# Patient Record
Sex: Female | Born: 1980
Health system: Southern US, Community
[De-identification: ages and names within clinical notes are randomized; demographics above are authoritative.]

## PROBLEM LIST (undated history)

## (undated) DIAGNOSIS — N938 Other specified abnormal uterine and vaginal bleeding: Secondary | ICD-10-CM

## (undated) DIAGNOSIS — K219 Gastro-esophageal reflux disease without esophagitis: Secondary | ICD-10-CM

## (undated) DIAGNOSIS — K635 Polyp of colon: Secondary | ICD-10-CM

## (undated) DIAGNOSIS — R197 Diarrhea, unspecified: Secondary | ICD-10-CM

## (undated) DIAGNOSIS — N390 Urinary tract infection, site not specified: Secondary | ICD-10-CM

## (undated) DIAGNOSIS — B019 Varicella without complication: Secondary | ICD-10-CM

## (undated) DIAGNOSIS — T7840XA Allergy, unspecified, initial encounter: Secondary | ICD-10-CM

## (undated) DIAGNOSIS — M5430 Sciatica, unspecified side: Secondary | ICD-10-CM

## (undated) HISTORY — DX: Urinary tract infection, site not specified: N39.0

## (undated) HISTORY — DX: Sciatica, unspecified side: M54.30

## (undated) HISTORY — DX: Diarrhea, unspecified: R19.7

## (undated) HISTORY — DX: Allergy, unspecified, initial encounter: T78.40XA

## (undated) HISTORY — DX: Varicella without complication: B01.9

## (undated) HISTORY — PX: OTHER SURGICAL HISTORY: SHX169

## (undated) HISTORY — DX: Polyp of colon: K63.5

---

## 2000-06-15 ENCOUNTER — Other Ambulatory Visit: Admission: RE | Admit: 2000-06-15 | Discharge: 2000-06-15 | Payer: Self-pay | Admitting: Gynecology

## 2000-11-15 ENCOUNTER — Emergency Department (HOSPITAL_COMMUNITY): Admission: EM | Admit: 2000-11-15 | Discharge: 2000-11-16 | Payer: Self-pay | Admitting: Emergency Medicine

## 2000-11-16 ENCOUNTER — Emergency Department (HOSPITAL_COMMUNITY): Admission: EM | Admit: 2000-11-16 | Discharge: 2000-11-17 | Payer: Self-pay | Admitting: Emergency Medicine

## 2001-07-14 ENCOUNTER — Other Ambulatory Visit: Admission: RE | Admit: 2001-07-14 | Discharge: 2001-07-14 | Payer: Self-pay | Admitting: Gynecology

## 2002-07-18 ENCOUNTER — Other Ambulatory Visit: Admission: RE | Admit: 2002-07-18 | Discharge: 2002-07-18 | Payer: Self-pay | Admitting: Gynecology

## 2005-03-25 ENCOUNTER — Other Ambulatory Visit: Admission: RE | Admit: 2005-03-25 | Discharge: 2005-03-25 | Payer: Self-pay | Admitting: Gynecology

## 2006-09-02 ENCOUNTER — Emergency Department (HOSPITAL_COMMUNITY): Admission: EM | Admit: 2006-09-02 | Discharge: 2006-09-02 | Payer: Self-pay | Admitting: Emergency Medicine

## 2006-11-16 ENCOUNTER — Emergency Department (HOSPITAL_COMMUNITY): Admission: EM | Admit: 2006-11-16 | Discharge: 2006-11-16 | Payer: Self-pay | Admitting: Emergency Medicine

## 2007-10-16 ENCOUNTER — Emergency Department (HOSPITAL_COMMUNITY): Admission: EM | Admit: 2007-10-16 | Discharge: 2007-10-17 | Payer: Self-pay | Admitting: Emergency Medicine

## 2007-10-18 ENCOUNTER — Emergency Department (HOSPITAL_COMMUNITY): Admission: EM | Admit: 2007-10-18 | Discharge: 2007-10-18 | Payer: Self-pay | Admitting: Emergency Medicine

## 2007-10-22 ENCOUNTER — Emergency Department (HOSPITAL_COMMUNITY): Admission: EM | Admit: 2007-10-22 | Discharge: 2007-10-23 | Payer: Self-pay | Admitting: Emergency Medicine

## 2007-12-23 ENCOUNTER — Emergency Department (HOSPITAL_COMMUNITY): Admission: EM | Admit: 2007-12-23 | Discharge: 2007-12-23 | Payer: Self-pay | Admitting: Emergency Medicine

## 2008-02-11 ENCOUNTER — Emergency Department (HOSPITAL_COMMUNITY): Admission: EM | Admit: 2008-02-11 | Discharge: 2008-02-11 | Payer: Self-pay | Admitting: Family Medicine

## 2009-10-04 ENCOUNTER — Emergency Department (HOSPITAL_COMMUNITY): Admission: EM | Admit: 2009-10-04 | Discharge: 2009-10-04 | Payer: Self-pay | Admitting: Family Medicine

## 2010-06-01 ENCOUNTER — Emergency Department (HOSPITAL_COMMUNITY)
Admission: EM | Admit: 2010-06-01 | Discharge: 2010-06-01 | Payer: Self-pay | Source: Home / Self Care | Admitting: Emergency Medicine

## 2010-06-01 LAB — POCT RAPID STREP A (OFFICE): Streptococcus, Group A Screen (Direct): NEGATIVE

## 2010-09-11 ENCOUNTER — Emergency Department (HOSPITAL_COMMUNITY)
Admission: EM | Admit: 2010-09-11 | Discharge: 2010-09-11 | Disposition: A | Discharge status: Home / Self Care | Payer: Self-pay | Attending: Emergency Medicine | Admitting: Emergency Medicine

## 2010-09-11 DIAGNOSIS — Z139 Encounter for screening, unspecified: Secondary | ICD-10-CM | POA: Insufficient documentation

## 2011-01-30 LAB — URINALYSIS, ROUTINE W REFLEX MICROSCOPIC
Bilirubin Urine: NEGATIVE
Glucose, UA: NEGATIVE
Hgb urine dipstick: NEGATIVE
Ketones, ur: NEGATIVE
Nitrite: NEGATIVE
Protein, ur: NEGATIVE
Specific Gravity, Urine: 1.004 — ABNORMAL LOW
Urobilinogen, UA: 0.2
pH: 7

## 2011-01-30 LAB — COMPREHENSIVE METABOLIC PANEL
ALT: 21
AST: 20
Albumin: 3.3 — ABNORMAL LOW
Alkaline Phosphatase: 49
BUN: 8
CO2: 25
Calcium: 8.6
Chloride: 101
Creatinine, Ser: 0.69
GFR calc Af Amer: >60
GFR calc non Af Amer: >60
Glucose, Bld: 108 — ABNORMAL HIGH
Potassium: 3 — ABNORMAL LOW
Sodium: 136
Total Bilirubin: 1.1
Total Protein: 5.8 — ABNORMAL LOW

## 2011-01-30 LAB — POCT PREGNANCY, URINE
Operator id: 294591
Preg Test, Ur: NEGATIVE

## 2011-01-30 LAB — CBC
HCT: 43.8
Hemoglobin: 14.6
MCHC: 33.3
MCV: 91.5
Platelets: 218
RBC: 4.79
RDW: 12.2
WBC: 11 — ABNORMAL HIGH

## 2011-01-30 LAB — PREGNANCY, URINE: Preg Test, Ur: NEGATIVE

## 2011-05-10 ENCOUNTER — Emergency Department (HOSPITAL_COMMUNITY)
Admission: EM | Admit: 2011-05-10 | Discharge: 2011-05-10 | Disposition: A | Discharge status: Home / Self Care | Payer: Self-pay | Source: Home / Self Care | Attending: Emergency Medicine | Admitting: Emergency Medicine

## 2011-05-10 ENCOUNTER — Encounter: Payer: Self-pay | Admitting: *Deleted

## 2011-05-10 DIAGNOSIS — J329 Chronic sinusitis, unspecified: Secondary | ICD-10-CM

## 2011-05-10 DIAGNOSIS — J069 Acute upper respiratory infection, unspecified: Secondary | ICD-10-CM

## 2011-05-10 HISTORY — DX: Gastro-esophageal reflux disease without esophagitis: K21.9

## 2011-05-10 MED ORDER — GUAIFENESIN-CODEINE 100-10 MG/5ML PO SYRP: 10.0000 mL | ORAL_SOLUTION | Freq: Four times a day (QID) | ORAL | Status: AC | PRN

## 2011-05-10 MED ORDER — AMOXICILLIN-POT CLAVULANATE 875-125 MG PO TABS: 1.0000 | ORAL_TABLET | Freq: Two times a day (BID) | ORAL | Status: AC

## 2011-05-10 NOTE — ED Provider Notes (Signed)
Chief Complaint  Patient presents with  . Nasal Congestion  . Sore Throat  . Headache  . Cough    History of Present Illness:  The patient is a 31 year old female who has had a five-day history of nasal congestion with clear drainage, sneezing, headache, sinus pressure, sore throat, cough productive of clear sputum, and chest congestion. She denies fevers, chills, sweats, aches, shortness of breath, wheezing, nausea, vomiting, or diarrhea.  Review of Systems:  Other than noted above, the patient denies any of the following symptoms. Systemic:  No fever, chills, sweats, fatigue, myalgias, headache, or anorexia. Eye:  No redness, pain or drainage. ENT:  No earache, nasal congestion, rhinorrhea, sinus pressure, or sore throat. Lungs:  No cough, sputum production, wheezing, shortness of breath. Or chest pain. GI:  No nausea, vomiting, abdominal pain or diarrhea. Skin:  No rash or itching.  PMFSH:  Past medical history, family history, social history, meds, and allergies were reviewed.  Physical Exam:   Vital signs:  BP 117/76  Pulse 69  Temp(Src) 98.3 F (36.8 C) (Oral)  Resp 14  SpO2 97%  LMP 05/01/2011 General:  Alert, in no distress. Eye:  No conjunctival injection or drainage. ENT:  TMs and canals were normal, without erythema or inflammation.  Nasal mucosa was clear and uncongested, without drainage.  Mucous membranes were moist.  Pharynx was clear, without exudate or drainage.  There were no oral ulcerations or lesions. Neck:  Supple, no adenopathy, tenderness or mass. Lungs:  No respiratory distress.  Lungs were clear to auscultation, without wheezes, rales or rhonchi.  Breath sounds were clear and equal bilaterally. Heart:  Regular rhythm, without gallops, murmers or rubs. Skin:  Clear, warm, and dry, without rash or lesions.  Labs:   Results for orders placed during the hospital encounter of 06/01/10  POCT RAPID STREP A      Component Value Range   Streptococcus, Group A  Screen (Direct) NEGATIVE  NEGATIVE      Radiology:  No results found.  Medications given in [facility]:  None  Assessment:  Viral upper respiratory infection. Sinusitis. Bronchitis.  Plan:   1.  The following meds were prescribed:   New Prescriptions   AMOXICILLIN-CLAVULANATE (AUGMENTIN) 875-125 MG PER TABLET    Take 1 tablet by mouth 2 (two) times daily.   GUAIFENESIN-CODEINE (GUIATUSS AC) 100-10 MG/5ML SYRUP    Take 10 mLs by mouth 4 (four) times daily as needed for cough.   2.  The patient was instructed in symptomatic care and handouts were given. 3.  The patient was told to return if becoming worse in any way, if no better in 3 or 4 days, and given some red flag symptoms that would indicate earlier return.   Roque Lias, MD 05/10/11 1501

## 2011-05-10 NOTE — ED Notes (Addendum)
sorethroat resolved - now with mild cough sinus congestion and pressure headache - took left over cephalexin two Thursday one today

## 2011-09-01 ENCOUNTER — Encounter (HOSPITAL_COMMUNITY): Payer: Self-pay | Admitting: Emergency Medicine

## 2011-09-01 ENCOUNTER — Emergency Department (HOSPITAL_COMMUNITY)
Admission: EM | Admit: 2011-09-01 | Discharge: 2011-09-01 | Disposition: A | Discharge status: Home / Self Care | Payer: Self-pay | Source: Home / Self Care | Attending: Family Medicine | Admitting: Family Medicine

## 2011-09-01 DIAGNOSIS — J309 Allergic rhinitis, unspecified: Secondary | ICD-10-CM

## 2011-09-01 DIAGNOSIS — J302 Other seasonal allergic rhinitis: Secondary | ICD-10-CM

## 2011-09-01 MED ORDER — FLUTICASONE PROPIONATE 50 MCG/ACT NA SUSP: 2.0000 | Freq: Every day | NASAL | Status: DC

## 2011-09-01 MED ORDER — CETIRIZINE HCL 10 MG PO TABS: 10.0000 mg | ORAL_TABLET | Freq: Every day | ORAL | Status: DC

## 2011-09-01 NOTE — ED Provider Notes (Signed)
History     CSN: [id number]  Arrival date & time 09/01/11  1627   First MD Initiated Contact with Patient 09/01/11 1639      No chief complaint on file.   (Consider location/radiation/quality/duration/timing/severity/associated sxs/prior treatment) Patient is a 31 y.o. female presenting with URI. The history is provided by the patient.  URI The primary symptoms include fatigue and sore throat. Primary symptoms do not include fever, nausea, vomiting, myalgias, arthralgias or rash. The current episode started yesterday. This is a new problem. The problem has not changed since onset. Symptoms associated with the illness include congestion and rhinorrhea.    Past Medical History  Diagnosis Date  . Acid reflux     No past surgical history on file.  No family history on file.  History  Substance Use Topics  . Smoking status: Never Smoker   . Smokeless tobacco: Not on file  . Alcohol Use: Yes    OB History    Grav Para Term Preterm Abortions TAB SAB Ect Mult Living                  Review of Systems  Constitutional: Positive for fatigue. Negative for fever.  HENT: Positive for congestion, sore throat, rhinorrhea, sneezing and postnasal drip.   Respiratory: Negative.   Gastrointestinal: Negative.  Negative for nausea and vomiting.  Genitourinary: Negative.   Musculoskeletal: Negative for myalgias and arthralgias.  Skin: Negative for rash.    Allergies  Ceclor  Home Medications   Current Outpatient Rx  Name Route Sig Dispense Refill  . CETIRIZINE HCL 10 MG PO TABS Oral Take 1 tablet (10 mg total) by mouth daily. One tab daily for allergies 30 tablet 1  . FLUTICASONE PROPIONATE 50 MCG/ACT NA SUSP Nasal Place 2 sprays into the nose daily. 1 g 2  . GUAIFENESIN ER 600 MG PO TB12 Oral Take 600 mg by mouth 2 (two) times daily.      Marland Kitchen PSEUDOEPHEDRINE HCL 30 MG PO TABS Oral Take 30 mg by mouth every 4 (four) hours as needed.        BP 99/59  Pulse 70  Temp(Src) 98.1  F (36.7 C) (Oral)  Resp 18  SpO2 100%  Physical Exam  Nursing note and vitals reviewed. Constitutional: She is oriented to person, place, and time. She appears well-developed and well-nourished.  HENT:  Head: Normocephalic.  Right Ear: External ear normal.  Left Ear: External ear normal.  Nose: Mucosal edema and rhinorrhea present.  Mouth/Throat: Oropharynx is clear and moist.  Eyes: Pupils are equal, round, and reactive to light.  Neck: Normal range of motion. Neck supple.  Cardiovascular: Normal rate, normal heart sounds and intact distal pulses.   Pulmonary/Chest: Effort normal and breath sounds normal.  Lymphadenopathy:    She has no cervical adenopathy.  Neurological: She is alert and oriented to person, place, and time.  Skin: Skin is warm and dry.  Psychiatric: She has a normal mood and affect.    ED Course  Procedures (including critical care time)  Labs Reviewed - No data to display No results found.   1. Seasonal allergic rhinitis       MDM          Linna Hoff, MD 09/01/11 1739

## 2011-09-01 NOTE — ED Notes (Signed)
PT HERE WITH SCRATCHY THROAT,H/A AND DRY MOUTH THAT STARTED YESTERDAY UNRELIEVED BY OTC NYQUIL.DENIES FEVER,CHILLS,N,V

## 2011-09-01 NOTE — Discharge Instructions (Signed)
Drink plenty of fluids as discussed, use medicine as prescribed, and mucinex or delsym for cough. Return or see your doctor if further problems °

## 2011-10-20 ENCOUNTER — Encounter: Payer: Self-pay | Admitting: *Deleted

## 2011-10-20 NOTE — Telephone Encounter (Signed)
Opened in error

## 2012-01-05 ENCOUNTER — Ambulatory Visit: Payer: Self-pay | Admitting: Internal Medicine

## 2012-01-05 VITALS — BP 112/68 | HR 74 | Temp 98.2°F | Resp 16 | Ht 65.0 in | Wt 158.0 lb

## 2012-01-05 DIAGNOSIS — J019 Acute sinusitis, unspecified: Secondary | ICD-10-CM

## 2012-01-05 MED ORDER — AMOXICILLIN 500 MG PO CAPS: 1000.0000 mg | ORAL_CAPSULE | Freq: Two times a day (BID) | ORAL | Status: DC

## 2012-01-05 MED ORDER — AMOXICILLIN 500 MG PO CAPS: 1000.0000 mg | ORAL_CAPSULE | Freq: Two times a day (BID) | ORAL | Status: AC

## 2012-01-05 NOTE — Addendum Note (Signed)
Addended by: Cydney Ok on: 01/05/2012 10:15 AM   Modules accepted: Orders

## 2012-01-05 NOTE — Progress Notes (Signed)
  Subjective:    Patient ID: Suzanne Atkinson, female    DOB: 1980/06/17, 31 y.o.   MRN: [id number]  HPI presents today with sinus congestion. Started with a sore throat Friday, has a slight cough, PND,    Allergic to Ceclor as a child but no problems with penicillin in History of allergic rhinitis seasonal Review of Systems No ongoing meds or problems    Objective:   Physical Exam TMs clear Nares with purulence/tender left maxillary to percussion Throat postnasal drainage obvious No nodes No wheezing       Assessment & Plan:  Problem #1 sinusitis Meds ordered this encounter  Medications  . amoxicillin (AMOXIL) 500 MG capsule    Sig: Take 2 capsules (1,000 mg total) by mouth 2 (two) times daily.    Dispense:  40 capsule    Refill:  0

## 2012-04-22 ENCOUNTER — Ambulatory Visit: Payer: Self-pay | Admitting: Physician Assistant

## 2012-04-22 VITALS — BP 116/77 | HR 82 | Temp 98.9°F | Resp 16 | Ht 64.5 in | Wt 162.0 lb

## 2012-04-22 DIAGNOSIS — J029 Acute pharyngitis, unspecified: Secondary | ICD-10-CM

## 2012-04-22 DIAGNOSIS — J40 Bronchitis, not specified as acute or chronic: Secondary | ICD-10-CM

## 2012-04-22 LAB — POCT RAPID STREP A (OFFICE): Rapid Strep A Screen: NEGATIVE

## 2012-04-22 MED ORDER — AZITHROMYCIN 250 MG PO TABS: ORAL_TABLET | ORAL | Status: DC

## 2012-04-22 NOTE — Progress Notes (Signed)
  Subjective:    Patient ID: Suzanne Atkinson, female    DOB: July 09, 1980, 31 y.o.   MRN: [id number]  HPI 31 year old female presents with 1 week history of nasal congestion, sore throat, slight dry cough, and some nausea. No vomiting, abdominal pain, headache, sinus pain, otalgia, fevers, or chills.  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 She does have a history of seasonal allergies for which she uses flonase and zyrtec prn.  She has not been using these with this illness.  Otherwise healthy with no other complaints today.      Review of Systems  Constitutional: Negative for fever and chills.  HENT: Positive for congestion, sore throat, rhinorrhea and postnasal drip. Negative for neck pain.   Respiratory: Positive for cough. Negative for chest tightness and wheezing.   Cardiovascular: Negative for chest pain.  Gastrointestinal: Negative for nausea, vomiting and abdominal pain.  Skin: Negative for rash.  Neurological: Negative for dizziness and headaches.       Objective:   Physical Exam  Constitutional: She is oriented to person, place, and time. She appears well-developed and well-nourished.  HENT:  Head: Normocephalic and atraumatic.  Right Ear: Hearing, tympanic membrane, external ear and ear canal normal.  Left Ear: Hearing, tympanic membrane, external ear and ear canal normal.  Mouth/Throat: Oropharynx is clear and moist. No oropharyngeal exudate (clear postnasal drainage).  Eyes: Conjunctivae normal are normal.  Neck: Normal range of motion.  Cardiovascular: Normal rate, regular rhythm and normal heart sounds.   Pulmonary/Chest: Effort normal and breath sounds normal.  Neurological: She is alert and oriented to person, place, and time.  Psychiatric: She has a normal mood and affect. Her behavior is normal. Judgment and thought content normal.          Assessment & Plan:   1. Acute pharyngitis  POCT rapid strep A,  Culture, Group A Strep  2. Bronchitis  azithromycin (ZITHROMAX) 250 MG tablet   Start Zyrtec daily  Flonase bid Azithromycin as directed She has cough syrup at home that she will try, ok to call in hycodan if needed.  Follow up if symptoms worsen or fail to improve

## 2012-04-25 LAB — CULTURE, GROUP A STREP: Organism ID, Bacteria: NORMAL

## 2012-09-16 ENCOUNTER — Emergency Department (HOSPITAL_COMMUNITY): Payer: BC Managed Care – PPO

## 2012-09-16 ENCOUNTER — Encounter (HOSPITAL_COMMUNITY): Payer: Self-pay | Admitting: Emergency Medicine

## 2012-09-16 ENCOUNTER — Emergency Department (HOSPITAL_COMMUNITY)
Admission: EM | Admit: 2012-09-16 | Discharge: 2012-09-16 | Disposition: A | Discharge status: Home / Self Care | Payer: BC Managed Care – PPO | Attending: Emergency Medicine | Admitting: Emergency Medicine

## 2012-09-16 DIAGNOSIS — Z3202 Encounter for pregnancy test, result negative: Secondary | ICD-10-CM | POA: Insufficient documentation

## 2012-09-16 DIAGNOSIS — R4781 Slurred speech: Secondary | ICD-10-CM

## 2012-09-16 DIAGNOSIS — R4789 Other speech disturbances: Secondary | ICD-10-CM | POA: Insufficient documentation

## 2012-09-16 DIAGNOSIS — Z8719 Personal history of other diseases of the digestive system: Secondary | ICD-10-CM | POA: Insufficient documentation

## 2012-09-16 LAB — CBC WITH DIFFERENTIAL/PLATELET
Basophils Absolute: 0 10*3/uL (ref 0.0–0.1)
Basophils Relative: 0 % (ref 0–1)
Eosinophils Absolute: 0.1 10*3/uL (ref 0.0–0.7)
Eosinophils Relative: 1 % (ref 0–5)
HCT: 42.1 % (ref 36.0–46.0)
Hemoglobin: 14.6 g/dL (ref 12.0–15.0)
Lymphocytes Relative: 28 % (ref 12–46)
Lymphs Abs: 1.8 10*3/uL (ref 0.7–4.0)
MCH: 31.1 pg (ref 26.0–34.0)
MCHC: 34.7 g/dL (ref 30.0–36.0)
MCV: 89.6 fL (ref 78.0–100.0)
Monocytes Absolute: 0.6 10*3/uL (ref 0.1–1.0)
Monocytes Relative: 9 % (ref 3–12)
Neutro Abs: 4.1 10*3/uL (ref 1.7–7.7)
Neutrophils Relative %: 62 % (ref 43–77)
Platelets: 224 10*3/uL (ref 150–400)
RBC: 4.7 MIL/uL (ref 3.87–5.11)
RDW: 12.8 % (ref 11.5–15.5)
WBC: 6.5 10*3/uL (ref 4.0–10.5)

## 2012-09-16 LAB — URINALYSIS, ROUTINE W REFLEX MICROSCOPIC
Bilirubin Urine: NEGATIVE
Glucose, UA: NEGATIVE mg/dL
Hgb urine dipstick: NEGATIVE
Ketones, ur: NEGATIVE mg/dL
Leukocytes, UA: NEGATIVE
Nitrite: NEGATIVE
Protein, ur: NEGATIVE mg/dL
Specific Gravity, Urine: 1.028 (ref 1.005–1.030)
Urobilinogen, UA: 0.2 mg/dL (ref 0.0–1.0)
pH: 6 (ref 5.0–8.0)

## 2012-09-16 LAB — COMPREHENSIVE METABOLIC PANEL
ALT: 5 U/L (ref 0–35)
AST: 17 U/L (ref 0–37)
Albumin: 3.8 g/dL (ref 3.5–5.2)
Alkaline Phosphatase: 49 U/L (ref 39–117)
BUN: 12 mg/dL (ref 6–23)
CO2: 27 mEq/L (ref 19–32)
Calcium: 9.7 mg/dL (ref 8.4–10.5)
Chloride: 99 mEq/L (ref 96–112)
Creatinine, Ser: 0.6 mg/dL (ref 0.50–1.10)
GFR calc Af Amer: >90 mL/min (ref >90)
GFR calc non Af Amer: >90 mL/min (ref >90)
Glucose, Bld: 96 mg/dL (ref 70–99)
Potassium: 4.4 mEq/L (ref 3.5–5.1)
Sodium: 136 mEq/L (ref 135–145)
Total Bilirubin: 0.2 mg/dL — ABNORMAL LOW (ref 0.3–1.2)
Total Protein: 7.2 g/dL (ref 6.0–8.3)

## 2012-09-16 LAB — GLUCOSE, CAPILLARY: Glucose-Capillary: 97 mg/dL (ref 70–99)

## 2012-09-16 LAB — POCT PREGNANCY, URINE: Preg Test, Ur: NEGATIVE
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
## 2012-09-16 NOTE — ED Notes (Addendum)
Pt states that about 20 mins ago she started to feel funny and began to have slurred speech also states that her left arm is sore. Denies n/v, headache, blurred vision, SOB.

## 2012-09-16 NOTE — ED Notes (Signed)
Patient transported to MRI 

## 2012-09-16 NOTE — ED Notes (Signed)
[facility], given crackers to eat. cbg rechecked in ED.

## 2012-09-16 NOTE — ED Notes (Signed)
Pt able to ambulate independently to bathroom

## 2012-09-16 NOTE — ED Provider Notes (Signed)
History     CSN: [id number]  Arrival date & time 09/16/12  1116   First MD Initiated Contact with Patient 09/16/12 1231      Chief Complaint  Patient presents with  . Aphasia    (Consider location/radiation/quality/duration/timing/severity/associated sxs/prior treatment) The history is provided by the patient and the spouse.   patient here complaining of trouble forming words and possible slurred speech which began about 20 minutes prior to arrival. Denies any severe headaches. No upper or lower extremity weakness. No visual changes. No history of same. Symptoms have been gradually improving and no treatment used prior to arrival. Nothing makes her symptoms worse.  Past Medical History  Diagnosis Date  . Acid reflux     History reviewed. No pertinent past surgical history.  Family History  Problem Relation Age of Onset  . Cancer Mother     History  Substance Use Topics  . Smoking status: Never Smoker   . Smokeless tobacco: Not on file  . Alcohol Use: Yes    OB History   Grav Para Term Preterm Abortions TAB SAB Ect Mult Living                  Review of Systems  All other systems reviewed and are negative.    Allergies  Ceclor  Home Medications   Current Outpatient Rx  Name  Route  Sig  Dispense  Refill  . ibuprofen (ADVIL,MOTRIN) 200 MG tablet   Oral   Take 400 mg by mouth every 6 (six) hours as needed for pain (headache).         . loratadine (CLARITIN) 10 MG tablet   Oral   Take 10 mg by mouth daily.           BP 152/93  Temp(Src) 97.3 F (36.3 C) (Oral)  Resp 26  SpO2 98%  LMP 08/23/2012  Physical Exam  Nursing note and vitals reviewed. Constitutional: She is oriented to person, place, and time. She appears well-developed and well-nourished.  Non-toxic appearance. No distress.  HENT:  Head: Normocephalic and atraumatic.  Eyes: Conjunctivae, EOM and lids are normal. Pupils are equal, round, and reactive to light.  Neck: Normal range  of motion. Neck supple. No tracheal deviation present. No mass present.  Cardiovascular: Normal rate, regular rhythm and normal heart sounds.  Exam reveals no gallop.   No murmur heard. Pulmonary/Chest: Effort normal and breath sounds normal. No stridor. No respiratory distress. She has no decreased breath sounds. She has no wheezes. She has no rhonchi. She has no rales.  Abdominal: Soft. Normal appearance and bowel sounds are normal. She exhibits no distension. There is no tenderness. There is no rebound and no CVA tenderness.  Musculoskeletal: Normal range of motion. She exhibits no edema and no tenderness.  Neurological: She is alert and oriented to person, place, and time. She has normal strength. No cranial nerve deficit or sensory deficit. GCS eye subscore is 4. GCS verbal subscore is 5. GCS motor subscore is 6.  Skin: Skin is warm and dry. No abrasion and no rash noted.  Psychiatric: She has a normal mood and affect. Her speech is normal and behavior is normal.    ED Course  Procedures (including critical care time)  Labs Reviewed  COMPREHENSIVE METABOLIC PANEL - Abnormal; Notable for the following:    Total Bilirubin 0.2 (*)    All other components within normal limits  URINALYSIS, ROUTINE W REFLEX MICROSCOPIC - Abnormal; Notable for the following:  APPearance CLOUDY (*)    All other components within normal limits  GLUCOSE, CAPILLARY  CBC WITH DIFFERENTIAL  POCT PREGNANCY, URINE   No results found.   No diagnosis found.    MDM  Pt with negative mri and normal neuro exam--pt to be given neuro referral        Toy Baker, MD 09/16/12 1510

## 2012-09-29 ENCOUNTER — Encounter: Payer: Self-pay | Admitting: Neurology

## 2012-09-29 ENCOUNTER — Ambulatory Visit (INDEPENDENT_AMBULATORY_CARE_PROVIDER_SITE_OTHER): Payer: BC Managed Care – PPO | Admitting: Neurology

## 2012-09-29 VITALS — BP 109/72 | HR 66 | Ht 64.0 in | Wt 162.0 lb

## 2012-09-29 DIAGNOSIS — R4789 Other speech disturbances: Secondary | ICD-10-CM

## 2012-09-29 DIAGNOSIS — K219 Gastro-esophageal reflux disease without esophagitis: Secondary | ICD-10-CM

## 2012-09-29 DIAGNOSIS — R4781 Slurred speech: Secondary | ICD-10-CM
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
## 2013-05-27 ENCOUNTER — Emergency Department (INDEPENDENT_AMBULATORY_CARE_PROVIDER_SITE_OTHER)
Admission: EM | Admit: 2013-05-27 | Discharge: 2013-05-27 | Disposition: A | Discharge status: Home / Self Care | Payer: Self-pay | Source: Home / Self Care

## 2013-05-27 ENCOUNTER — Encounter (HOSPITAL_COMMUNITY): Payer: Self-pay | Admitting: Emergency Medicine

## 2013-05-27 DIAGNOSIS — J069 Acute upper respiratory infection, unspecified: Secondary | ICD-10-CM

## 2013-05-27 HISTORY — DX: Other specified abnormal uterine and vaginal bleeding: N93.8

## 2013-05-27 NOTE — ED Provider Notes (Signed)
Medical screening examination/treatment/procedure(s) were performed by resident physician or non-physician practitioner and as supervising physician I was immediately available for consultation/collaboration.   Pauline Good MD.   Billy Fischer, MD 05/27/13 [phone]

## 2013-05-27 NOTE — Discharge Instructions (Signed)
Upper Respiratory Infection, Adult An upper respiratory infection (URI) is also sometimes known as the common cold. The upper respiratory tract includes the nose, sinuses, throat, trachea, and bronchi. Bronchi are the airways leading to the lungs. Most people improve within 1 week, but symptoms can last up to 2 weeks. A residual cough may last even longer.  CAUSES Many different viruses can infect the tissues lining the upper respiratory tract. The tissues become irritated and inflamed and often become very moist. Mucus production is also common. A cold is contagious. You can easily spread the virus to others by oral contact. This includes kissing, sharing a glass, coughing, or sneezing. Touching your mouth or nose and then touching a surface, which is then touched by another person, can also spread the virus. SYMPTOMS  Symptoms typically develop 1 to 3 days after you come in contact with a cold virus. Symptoms vary from person to person. They may include:  Runny nose.  Sneezing.  Nasal congestion.  Sinus irritation.  Sore throat.  Loss of voice (laryngitis).  Cough.  Fatigue.  Muscle aches.  Loss of appetite.  Headache.  Low-grade fever. DIAGNOSIS  You might diagnose your own cold based on familiar symptoms, since most people get a cold 2 to 3 times a year. Your caregiver can confirm this based on your exam. Most importantly, your caregiver can check that your symptoms are not due to another disease such as strep throat, sinusitis, pneumonia, asthma, or epiglottitis. Blood tests, throat tests, and X-rays are not necessary to diagnose a common cold, but they may sometimes be helpful in excluding other more serious diseases. Your caregiver will decide if any further tests are required. RISKS AND COMPLICATIONS  You may be at risk for a more severe case of the common cold if you smoke cigarettes, have chronic heart disease (such as heart failure) or lung disease (such as asthma), or if  you have a weakened immune system. The very young and very old are also at risk for more serious infections. Bacterial sinusitis, middle ear infections, and bacterial pneumonia can complicate the common cold. The common cold can worsen asthma and chronic obstructive pulmonary disease (COPD). Sometimes, these complications can require emergency medical care and may be life-threatening. PREVENTION  The best way to protect against getting a cold is to practice good hygiene. Avoid oral or hand contact with people with cold symptoms. Wash your hands often if contact occurs. There is no clear evidence that vitamin C, vitamin E, echinacea, or exercise reduces the chance of developing a cold. However, it is always recommended to get plenty of rest and practice good nutrition. TREATMENT  Treatment is directed at relieving symptoms. There is no cure. Antibiotics are not effective, because the infection is caused by a virus, not by bacteria. Treatment may include:  Increased fluid intake. Sports drinks offer valuable electrolytes, sugars, and fluids.  Breathing heated mist or steam (vaporizer or shower).  Eating chicken soup or other clear broths, and maintaining good nutrition.  Getting plenty of rest.  Using gargles or lozenges for comfort.  Controlling fevers with ibuprofen or acetaminophen as directed by your caregiver.  Increasing usage of your inhaler if you have asthma. Zinc gel and zinc lozenges, taken in the first 24 hours of the common cold, can shorten the duration and lessen the severity of symptoms. Pain medicines may help with fever, muscle aches, and throat pain. A variety of non-prescription medicines are available to treat congestion and runny nose. Your caregiver  can make recommendations and may suggest nasal or lung inhalers for other symptoms.  HOME CARE INSTRUCTIONS   Only take over-the-counter or prescription medicines for pain, discomfort, or fever as directed by your  caregiver.  Use a warm mist humidifier or inhale steam from a shower to increase air moisture. This may keep secretions moist and make it easier to breathe.  Drink enough water and fluids to keep your urine clear or pale yellow.  Rest as needed.  Return to work when your temperature has returned to normal or as your caregiver advises. You may need to stay home longer to avoid infecting others. You can also use a face mask and careful hand washing to prevent spread of the virus. SEEK MEDICAL CARE IF:   After the first few days, you feel you are getting worse rather than better.  You need your caregiver's advice about medicines to control symptoms.  You develop chills, worsening shortness of breath, or brown or red sputum. These may be signs of pneumonia.  You develop yellow or brown nasal discharge or pain in the face, especially when you bend forward. These may be signs of sinusitis.  You develop a fever, swollen neck glands, pain with swallowing, or white areas in the back of your throat. These may be signs of strep throat. SEEK IMMEDIATE MEDICAL CARE IF:   You have a fever.  You develop severe or persistent headache, ear pain, sinus pain, or chest pain.  You develop wheezing, a prolonged cough, cough up blood, or have a change in your usual mucus (if you have chronic lung disease).  You develop sore muscles or a stiff neck. Document Released: 10/15/2000 Document Revised: 07/14/2011 Document Reviewed: 08/23/2010 [facility] Patient Information 2014 [place], [place].  Antibiotic Nonuse  Your caregiver felt that the infection or problem was not one that would be helped with an antibiotic. Infections may be caused by viruses or bacteria. Only a caregiver can tell which one of these is the likely cause of an illness. A cold is the most common cause of infection in both adults and children. A cold is a virus. Antibiotic treatment will have no effect on a viral infection. Viruses can lead to  many lost days of work caring for sick children and many missed days of school. Children may catch as many as 10 "colds" or "flus" per year during which they can be tearful, cranky, and uncomfortable. The goal of treating a virus is aimed at keeping the ill person comfortable. Antibiotics are medications used to help the body fight bacterial infections. There are relatively few types of bacteria that cause infections but there are hundreds of viruses. While both viruses and bacteria cause infection they are very different types of germs. A viral infection will typically go away by itself within 7 to 10 days. Bacterial infections may spread or get worse without antibiotic treatment. Examples of bacterial infections are:  Sore throats (like strep throat or tonsillitis).  Infection in the lung (pneumonia).  Ear and skin infections. Examples of viral infections are:  Colds or flus.  Most coughs and bronchitis.  Sore throats not caused by Strep.  Runny noses. It is often best not to take an antibiotic when a viral infection is the cause of the problem. Antibiotics can kill off the helpful bacteria that we have inside our body and allow harmful bacteria to start growing. Antibiotics can cause side effects such as allergies, nausea, and diarrhea without helping to improve the symptoms of the  to improve the symptoms of the viral infection. Additionally, repeated uses of antibiotics can cause bacteria inside of our body to become resistant. That resistance can be passed onto harmful bacterial. The next time you have an infection it may be harder to treat if antibiotics are used when they are not needed. Not treating with antibiotics allows our own immune system to develop and take care of infections more efficiently. Also, antibiotics will work better for us when they are prescribed for bacterial infections. °Treatments for a child that is ill may include: °· Give extra fluids throughout the day to stay hydrated. °· Get plenty of rest. °· Only give your child  over-the-counter or prescription medicines for pain, discomfort, or fever as directed by your caregiver. °· The use of a cool mist humidifier may help stuffy noses. °· Cold medications if suggested by your caregiver. °Your caregiver may decide to start you on an antibiotic if: °· The problem you were seen for today continues for a longer length of time than expected. °· You develop a secondary bacterial infection. °SEEK MEDICAL CARE IF: °· Fever lasts longer than 5 days. °· Symptoms continue to get worse after 5 to 7 days or become severe. °· Difficulty in breathing develops. °· Signs of dehydration develop (poor drinking, rare urinating, dark colored urine). °· Changes in behavior or worsening tiredness (listlessness or lethargy). °Document Released: 06/30/2001 Document Revised: 07/14/2011 Document Reviewed: 12/27/2008 °[employer]® Patient Information ©2014 [employer]. ° °

## 2013-05-27 NOTE — ED Notes (Signed)
Prone to sinus infections; c/o URI type syx for past few days. Minimal relief w OTC medications. Clear nasal secretions, cough, facial pain and pressure; NAD

## 2013-05-27 NOTE — ED Provider Notes (Signed)
CSN: [id number]     Arrival date & time 05/27/13  1112 History   First MD Initiated Contact with Patient 05/27/13 1153     Chief Complaint  Patient presents with  . URI   (Consider location/radiation/quality/duration/timing/severity/associated sxs/prior Treatment) HPI Comments: 33 year old female presents with a scratchy throat, sniffles and sneezing for 2 days. Denies fever. Has taken one dose of DayQuil and 2 doses of the same. This did not help.   Past Medical History  Diagnosis Date  . Acid reflux   . DUB (dysfunctional uterine bleeding)    Past Surgical History  Procedure Laterality Date  . None     Family History  Problem Relation Age of Onset  . Cancer - Colon Father   . High blood pressure Mother   . High blood pressure Maternal Grandmother    History  Substance Use Topics  . Smoking status: Never Smoker   . Smokeless tobacco: Never Used  . Alcohol Use: 0.6 oz/week    1 Glasses of wine per week     Comment: Once a month   OB History   Grav Para Term Preterm Abortions TAB SAB Ect Mult Living                 Review of Systems  Constitutional: Negative for fever, chills, diaphoresis, activity change, appetite change and fatigue.  HENT: Positive for congestion, postnasal drip, rhinorrhea, sinus pressure and sore throat. Negative for facial swelling.   Eyes: Negative.   Respiratory: Positive for cough. Negative for shortness of breath and wheezing.   Cardiovascular: Negative.   Gastrointestinal: Negative.   Musculoskeletal: Negative for neck pain and neck stiffness.  Skin: Negative for pallor and rash.  Neurological: Negative.     Allergies  Ceclor  Home Medications   Current Outpatient Rx  Name  Route  Sig  Dispense  Refill  . ibuprofen (ADVIL,MOTRIN) 200 MG tablet   Oral   Take 400 mg by mouth every 6 (six) hours as needed for pain (headache).         . loratadine (CLARITIN) 10 MG tablet   Oral   Take 10 mg by mouth daily.          BP 121/85   Pulse 67  Temp(Src) 98 F (36.7 C) (Oral)  Resp 16  SpO2 99% Physical Exam  Nursing note and vitals reviewed. Constitutional: She is oriented to person, place, and time. She appears well-developed and well-nourished. No distress.  HENT:  Mouth/Throat: No oropharyngeal exudate.  Bilateral TMs are normal Oropharynx moist, clear PND.  Eyes: Conjunctivae and EOM are normal.  Neck: Normal range of motion. Neck supple.  Cardiovascular: Normal rate, regular rhythm and normal heart sounds.   Pulmonary/Chest: Effort normal and breath sounds normal. No respiratory distress. She has no wheezes. She has no rales.  Musculoskeletal: Normal range of motion. She exhibits no edema.  Lymphadenopathy:    She has no cervical adenopathy.  Neurological: She is alert and oriented to person, place, and time.  Skin: Skin is warm and dry. No rash noted.  Psychiatric: She has a normal mood and affect.    ED Course  Procedures (including critical care time) Labs Review Labs Reviewed - No data to display Imaging Review No results found.    MDM   1. URI (upper respiratory infection)       Add  Allegra 180 mg to the DayQuil as directed.  plenty of fluids stay well hydrated Rest  Janne Napoleon, NP 05/27/13  1209 

## 2013-11-03 ENCOUNTER — Encounter: Payer: Self-pay | Admitting: Family Medicine

## 2013-11-03 ENCOUNTER — Ambulatory Visit (INDEPENDENT_AMBULATORY_CARE_PROVIDER_SITE_OTHER): Payer: BC Managed Care – PPO | Admitting: Family Medicine

## 2013-11-03 VITALS — BP 110/70 | HR 73 | Temp 98.0°F | Ht 64.0 in | Wt 169.0 lb

## 2013-11-03 DIAGNOSIS — J069 Acute upper respiratory infection, unspecified: Secondary | ICD-10-CM

## 2013-11-03 NOTE — Progress Notes (Signed)
Pre visit review using our clinic review tool, if applicable. No additional management support is needed unless otherwise documented below in the visit note. 

## 2013-11-03 NOTE — Patient Instructions (Signed)
-  afrin for 4 days - STOP after 4 days  -nasal saline as often as you wish use proper cleaning per box instructions  -tylenol or ibuprofen - no more then per instructions on packaging  -follow up immediately if fevers of 100, facial pain that does not go away in 3-4 days, worsening or other concerns

## 2013-11-03 NOTE — Progress Notes (Signed)
No chief complaint on file.   HPI:  Note: new patient visit scheduled for later this month -started: 1 week ago -symptoms:nasal congestion, sore throat, cough, scratchy throat, R ear pressure on and off -denies:fever, SOB, NVD, tooth pain -has tried: airborne, sudafed, ibuprofen, dayquil, nyquil -sick contacts/travel/risks: husband and coworkers all with the same symptoms, denies flu exposure, tick exposure or or Ebola risks -Hx of: allergies, no hx of chronic lung disease on immune disorder  ROS: See pertinent positives and negatives per HPI.  Past Medical History  Diagnosis Date  . Acid reflux   . DUB (dysfunctional uterine bleeding)     Past Surgical History  Procedure Laterality Date  . None      Family History  Problem Relation Age of Onset  . Cancer - Colon Father   . High blood pressure Mother   . High blood pressure Maternal Grandmother     History   Social History  . Marital Status: Married    Spouse Name: Fransisco Beau    Number of Children: 0  . Years of Education: 12   Occupational History  . Public relations account executive    Social History Main Topics  . Smoking status: Never Smoker   . Smokeless tobacco: Never Used  . Alcohol Use: 0.6 oz/week    1 Glasses of wine per week     Comment: Once a month  . Drug Use: No  . Sexual Activity: None   Other Topics Concern  . None   Social History Narrative   Patient lives at home with husband Fransisco Beau). 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  Patient has a high school education. Right handed.    No current outpatient prescriptions on file.  EXAM:  Filed Vitals:   11/03/13 0910  BP: 110/70  Pulse: 73  Temp: 98 F (36.7 C)    Body mass index is 28.99 kg/(m^2).  GENERAL: vitals reviewed and listed above, alert, oriented, appears well hydrated and in no acute distress  HEENT: atraumatic, conjunttiva clear, no obvious abnormalities on inspection of external nose and ears, normal appearance of  ear canals and TMs, clear nasal congestion, mild post oropharyngeal erythema with PND, no tonsillar edema or exudate, no sinus TTP  NECK: no obvious masses on inspection  LUNGS: clear to auscultation bilaterally, no wheezes, rales or rhonchi, good air movement  CV: HRRR, no peripheral edema  MS: moves all extremities without noticeable abnormality  PSYCH: pleasant and cooperative, no obvious depression or anxiety  ASSESSMENT AND PLAN:  Discussed the following assessment and plan:  Upper respiratory infection  -given HPI and exam findings today, a serious infection or illness is unlikely. We discussed potential etiologies, with VURI being most likely, and advised supportive care and monitoring. We discussed treatment side effects, likely course, antibiotic misuse, transmission, and signs of developing a serious illness. -of course, we advised to return or notify a doctor immediately if symptoms worsen or persist or new concerns arise.    Patient Instructions  -afrin for 4 days - STOP after 4 days  -nasal saline as often as you wish use proper cleaning per box instructions  -tylenol or ibuprofen - no more then per instructions on packaging  -follow up immediately if fevers of 100, facial pain that does not go away in 3-4 days, worsening or other concerns       Shellie Goettl R.

## 2013-12-01 ENCOUNTER — Ambulatory Visit: Payer: Self-pay | Admitting: Family Medicine

## 2014-01-12 ENCOUNTER — Encounter: Payer: Self-pay | Admitting: Family Medicine

## 2014-01-12 DIAGNOSIS — Z0289 Encounter for other administrative examinations: Secondary | ICD-10-CM

## 2014-01-12 NOTE — Progress Notes (Signed)
Error   This encounter was created in error - please disregard. 

## 2014-02-01 ENCOUNTER — Encounter (HOSPITAL_COMMUNITY): Payer: Self-pay | Admitting: Emergency Medicine

## 2014-02-01 ENCOUNTER — Emergency Department (HOSPITAL_COMMUNITY)
Admission: EM | Admit: 2014-02-01 | Discharge: 2014-02-01 | Disposition: A | Discharge status: Home / Self Care | Payer: BC Managed Care – PPO | Attending: Emergency Medicine | Admitting: Emergency Medicine

## 2014-02-01 DIAGNOSIS — L299 Pruritus, unspecified: Secondary | ICD-10-CM | POA: Insufficient documentation

## 2014-02-01 DIAGNOSIS — R110 Nausea: Secondary | ICD-10-CM | POA: Insufficient documentation

## 2014-02-01 DIAGNOSIS — Z8742 Personal history of other diseases of the female genital tract: Secondary | ICD-10-CM | POA: Insufficient documentation

## 2014-02-01 DIAGNOSIS — R6889 Other general symptoms and signs: Secondary | ICD-10-CM | POA: Insufficient documentation

## 2014-02-01 DIAGNOSIS — T4995XA Adverse effect of unspecified topical agent, initial encounter: Secondary | ICD-10-CM | POA: Insufficient documentation

## 2014-02-01 DIAGNOSIS — Z8719 Personal history of other diseases of the digestive system: Secondary | ICD-10-CM | POA: Insufficient documentation

## 2014-02-01 DIAGNOSIS — T7840XA Allergy, unspecified, initial encounter: Secondary | ICD-10-CM

## 2014-02-01 DIAGNOSIS — R61 Generalized hyperhidrosis: Secondary | ICD-10-CM | POA: Diagnosis not present

## 2014-02-01 DIAGNOSIS — Z79899 Other long term (current) drug therapy: Secondary | ICD-10-CM | POA: Insufficient documentation

## 2014-02-01 DIAGNOSIS — L539 Erythematous condition, unspecified: Secondary | ICD-10-CM | POA: Diagnosis not present

## 2014-02-01 MED ORDER — FAMOTIDINE IN NACL 20-0.9 MG/50ML-% IV SOLN
20.0000 mg | Freq: Once | INTRAVENOUS | Status: AC
  Administered 2014-02-01: 20 mg via INTRAVENOUS
  Filled 2014-02-01: qty 50

## 2014-02-01 MED ORDER — SODIUM CHLORIDE 0.9 % IV SOLN
Freq: Once | INTRAVENOUS | Status: AC
  Administered 2014-02-01: 16:00:00 via INTRAVENOUS

## 2014-02-01 MED ORDER — DIPHENHYDRAMINE HCL 50 MG/ML IJ SOLN
25.0000 mg | Freq: Once | INTRAMUSCULAR | Status: AC
  Administered 2014-02-01: 25 mg via INTRAVENOUS
  Filled 2014-02-01: qty 1

## 2014-02-01 MED ORDER — PREDNISONE 20 MG PO TABS: 40.0000 mg | ORAL_TABLET | Freq: Every day | ORAL | Status: DC

## 2014-02-01 MED ORDER — METHYLPREDNISOLONE SODIUM SUCC 125 MG IJ SOLR
125.0000 mg | Freq: Once | INTRAMUSCULAR | Status: AC
  Administered 2014-02-01: 125 mg via INTRAVENOUS
  Filled 2014-02-01: qty 2

## 2014-02-01 MED ORDER — FAMOTIDINE 20 MG PO TABS: 20.0000 mg | ORAL_TABLET | Freq: Two times a day (BID) | ORAL | Status: DC

## 2014-02-01 NOTE — ED Notes (Signed)
Pt states that she began having hot flashes and throat feeling funny with red face after eating some [place] food at [employer].  Does not have any known allergens.  Pt states that she does not feel short of breath.  No hives or itching.  States her lips feel weird.  Took a hydroxyzine.

## 2014-02-01 NOTE — Discharge Instructions (Signed)
1. Medications: Prednisone, Benadryl every 6 hours for the next 24 hours, Pepcid, usual home medications 2. Treatment: rest, drink plenty of fluids, take medications as prescribed 3. Follow Up: Please followup with your primary doctor in 2 days for discussion of your diagnoses and further evaluation after today's visit; if you do not have a primary care doctor use the resource guide provided to find one; followup with dermatology as needed   Food Allergy A food allergy occurs from eating something you are sensitive to. Food allergies occur in all age groups. It may be passed to you from your parents (heredity).  CAUSES  Some common causes are cow's milk, seafood, eggs, nuts (including peanut butter), wheat, and soybeans. SYMPTOMS  Common problems are:   Swelling around the mouth.  An itchy, red rash.  Hives.  Vomiting.  Diarrhea. Severe allergic reactions are life-threatening. This reaction is called anaphylaxis. It can cause the mouth and throat to swell. This makes it hard to breathe and swallow. In severe reactions, only a small amount of food may be fatal within seconds. HOME CARE INSTRUCTIONS   If you are unsure what caused the reaction, keep a diary of foods eaten and symptoms that followed. Avoid foods that cause reactions.  If hives or rash are present:  Take medicines as directed.  Use an over-the-counter antihistamine (diphenhydramine) to treat hives and itching as needed.  Apply cold compresses to the skin or take baths in cool water. Avoid hot baths or showers. These will increase the redness and itching.  If you are severely allergic:  Hospitalization is often required following a severe reaction.  Wear a medical alert bracelet or necklace that describes the allergy.  Carry your anaphylaxis kit or epinephrine injection with you at all times. Both you and your family members should know how to use this. This can be lifesaving if you have a severe reaction. If  epinephrine is used, it is important for you to seek immediate medical care or call your local emergency services (911 in [place] When the epinephrine wears off, it can be followed by a delayed reaction, which can be fatal.  Replace your epinephrine immediately after use in case of another reaction.  Ask your caregiver for instructions if you have not been taught how to use an epinephrine injection.  Do not drive until medicines used to treat the reaction have worn off, unless approved by your caregiver. SEEK MEDICAL CARE IF:   You suspect a food allergy. Symptoms generally happen within 30 minutes of eating a food.  Your symptoms have not gone away within 2 days. See your caregiver sooner if symptoms are getting worse.  You develop new symptoms.  You want to retest yourself with a food or drink you think causes an allergic reaction. Never do this if an anaphylactic reaction to that food or drink has happened before.  There is a return of the symptoms which brought you to your caregiver. SEEK IMMEDIATE MEDICAL CARE IF:   You have trouble breathing, are wheezing, or you have a tight feeling in your chest or throat.  You have a swollen mouth, or you have hives, swelling, or itching all over your body. Use your epinephrine injection immediately. This is given into the outside of your thigh, deep into the muscle. Following use of the epinephrine injection, seek help right away. Seek immediate medical care or call your local emergency services (911 in [place] MAKE SURE YOU:   Understand these instructions.  Will watch your  condition.  Will get help right away if you are not doing well or get worse. Document Released: 04/18/2000 Document Revised: 07/14/2011 Document Reviewed: 12/09/2007 [facility] Patient Information 2015 [place], [place]. This information is not intended to replace advice given to you by your health care provider. Make sure you discuss any questions you have with your health  care provider.

## 2014-02-01 NOTE — ED Provider Notes (Signed)
CSN: [id number]     Arrival date & time 02/01/14  1333 History   First MD Initiated Contact with Patient 02/01/14 1504     Chief Complaint  Patient presents with  . Allergic Reaction     (Consider location/radiation/quality/duration/timing/severity/associated sxs/prior Treatment) The history is provided by the patient and medical records. No language interpreter was used.    Suzanne Atkinson is a 33 y.o. female  with a hx of acid reflux presents to the Emergency Department complaining of gradual, persistent, progressively improving allergic reaction onset 1PM while eating some [place]ese food from [employer]. Associated symptoms include feeling hot, sweating, redness in the face, subjective feeling of throat being tight and swelling of the tongue and lips (feeling only).  Pt reports eating this food several days ago without a reaction. Pt took hydroxyzine with mild relief, but persistence of a scratchy throat and generalized itching. Pt with questionable SOB and nauesa earlier.    Pt denies fever, chills, headache, neck pain, chest pain, abd, V/D, weakness, dizziness, syncope.       Past Medical History  Diagnosis Date  . Acid reflux   . DUB (dysfunctional uterine bleeding)    Past Surgical History  Procedure Laterality Date  . None     Family History  Problem Relation Age of Onset  . Cancer - Colon Father   . High blood pressure Mother   . High blood pressure Maternal Grandmother    History  Substance Use Topics  . Smoking status: Never Smoker   . Smokeless tobacco: Never Used  . Alcohol Use: 0.6 oz/week    1 Glasses of wine per week     Comment: Once a month   OB History   Grav Para Term Preterm Abortions TAB SAB Ect Mult Living                 Review of Systems  Constitutional: Negative for fever, diaphoresis, appetite change, fatigue and unexpected weight change.  HENT: Negative for mouth sores.        Scratchy throat  Eyes: Negative for visual disturbance.   Respiratory: Negative for cough, chest tightness, shortness of breath and wheezing.   Cardiovascular: Negative for chest pain.  Gastrointestinal: Positive for nausea. Negative for vomiting, abdominal pain, diarrhea and constipation.  Endocrine: Negative for polydipsia, polyphagia and polyuria.  Genitourinary: Negative for dysuria, urgency, frequency and hematuria.  Musculoskeletal: Negative for back pain and neck stiffness.  Skin: Negative for rash.       Itching  Allergic/Immunologic: Negative for immunocompromised state.  Neurological: Negative for syncope, light-headedness and headaches.  Hematological: Does not bruise/bleed easily.  Psychiatric/Behavioral: Negative for sleep disturbance. The patient is not nervous/anxious.       Allergies  Ceclor  Home Medications   Prior to Admission medications   Medication Sig Start Date End Date Taking? Authorizing Provider  hydrOXYzine (ATARAX/VISTARIL) 25 MG tablet Take 25 mg by mouth 3 (three) times daily as needed.   Yes Historical Provider, MD  levonorgestrel (MIRENA) 20 MCG/24HR IUD 1 each by Intrauterine route once.   Yes Historical Provider, MD  famotidine (PEPCID) 20 MG tablet Take 1 tablet (20 mg total) by mouth 2 (two) times daily. 02/01/14   Skye Rodarte, PA-C  predniSONE (DELTASONE) 20 MG tablet Take 2 tablets (40 mg total) by mouth daily. 02/01/14   Mitsugi Schrader, PA-C   BP 125/66  Pulse 74  Temp(Src) 97.8 F (36.6 C) (Oral)  Resp 18  SpO2 100% Physical Exam  Nursing note and vitals reviewed. Constitutional: She is oriented to person, place, and time. She appears well-developed and well-nourished. No distress.  HENT:  Head: Normocephalic and atraumatic.  Right Ear: Tympanic membrane, external ear and ear canal normal.  Left Ear: Tympanic membrane, external ear and ear canal normal.  Nose: Nose normal. No mucosal edema or rhinorrhea.  Mouth/Throat: Uvula is midline. No uvula swelling. No oropharyngeal  exudate, posterior oropharyngeal edema, posterior oropharyngeal erythema or tonsillar abscesses.  No swelling of the uvula or oropharynx   Eyes: Conjunctivae are normal.  Neck: Normal range of motion.  Patent airway No stridor; normal phonation Handling secretions without difficulty  Cardiovascular: Normal rate, normal heart sounds and intact distal pulses.   No murmur heard. Pulmonary/Chest: Effort normal and breath sounds normal. No stridor. No respiratory distress. She has no wheezes.  No wheezes or rhonchi  Abdominal: Soft. Bowel sounds are normal. There is no tenderness.  abd soft and nontender  Musculoskeletal: Normal range of motion. She exhibits no edema.  Neurological: She is alert and oriented to person, place, and time.  Skin: Skin is warm and dry. No rash noted. She is not diaphoretic. No erythema.  No urticaria or rash noted Mild excoriations over the arms and neck- no induration or fluctuance to indicate secondary infection  Psychiatric: She has a normal mood and affect.    ED Course  Procedures (including critical care time) Labs Review Labs Reviewed - No data to display  Imaging Review No results found.   EKG Interpretation None      MDM   Final diagnoses:  Allergic reaction, initial encounter   Suzanne Atkinson presents with c/o possible allergic reaction after eating some new food.  Pt with "scratchy throat" and feeling itchy.  Will give benadryl, prednisone and pepcid.  No urticaria, stridor or evidence of anaphylaxis.    4:58 PM Pt reports complete resolution of all symptoms and wishes to be d/c home.  Patient re-evaluated prior to dc, is hemodynamically stable, in no respiratory distress, and denies the feeling of throat closing. Pt has been advised to take OTC benadryl & return to the ED if they have a mod-severe allergic rxn (s/s including throat closing, difficulty breathing, swelling of lips face or tongue). Pt is to follow up with their PCP. Pt is  agreeable with plan & verbalizes understanding.  BP 125/66  Pulse 74  Temp(Src) 97.8 F (36.6 C) (Oral)  Resp 18  SpO2 100%    Abigail Butts, PA-C 02/01/14 1659

## 2014-02-01 NOTE — ED Provider Notes (Signed)
Medical screening examination/treatment/procedure(s) were performed by non-physician practitioner and as supervising physician I was immediately available for consultation/collaboration.  Kryslyn Helbig T Jerral Mccauley, MD 02/01/14 2314 

## 2014-02-27 ENCOUNTER — Encounter: Payer: BC Managed Care – PPO | Admitting: Family Medicine

## 2014-02-27 DIAGNOSIS — Z0289 Encounter for other administrative examinations: Secondary | ICD-10-CM

## 2014-02-27 NOTE — Progress Notes (Signed)
Error   This encounter was created in error - please disregard. 

## 2018-01-08 ENCOUNTER — Other Ambulatory Visit: Payer: Self-pay | Admitting: Gastroenterology

## 2018-01-08 DIAGNOSIS — K56699 Other intestinal obstruction unspecified as to partial versus complete obstruction: Secondary | ICD-10-CM

## 2018-01-19 ENCOUNTER — Ambulatory Visit
Admission: RE | Admit: 2018-01-19 | Discharge: 2018-01-19 | Disposition: A | Discharge status: Home / Self Care | Payer: 59 | Source: Ambulatory Visit | Attending: Gastroenterology | Admitting: Gastroenterology

## 2018-01-19 ENCOUNTER — Encounter: Payer: Self-pay | Admitting: Radiology

## 2018-01-19 DIAGNOSIS — K56699 Other intestinal obstruction unspecified as to partial versus complete obstruction: Secondary | ICD-10-CM

## 2018-01-19 MED ORDER — IOPAMIDOL (ISOVUE-300) INJECTION 61%
100.0000 mL | Freq: Once | INTRAVENOUS | Status: AC | PRN
  Administered 2018-01-19: 100 mL via INTRAVENOUS

## 2018-04-30 ENCOUNTER — Encounter: Payer: Self-pay | Admitting: Emergency Medicine

## 2018-04-30 ENCOUNTER — Ambulatory Visit
Admission: EM | Admit: 2018-04-30 | Discharge: 2018-04-30 | Disposition: A | Discharge status: Home / Self Care | Payer: 59 | Attending: Emergency Medicine | Admitting: Emergency Medicine

## 2018-04-30 DIAGNOSIS — J209 Acute bronchitis, unspecified: Secondary | ICD-10-CM | POA: Insufficient documentation

## 2018-04-30 MED ORDER — FLUTICASONE PROPIONATE 50 MCG/ACT NA SUSP: 1.0000 | Freq: Every day | NASAL | 2 refills | Status: DC

## 2018-04-30 MED ORDER — CETIRIZINE HCL 10 MG PO CAPS: 10.0000 mg | ORAL_CAPSULE | Freq: Every day | ORAL | 0 refills | Status: DC

## 2018-04-30 MED ORDER — PREDNISONE 50 MG PO TABS: 50.0000 mg | ORAL_TABLET | Freq: Every day | ORAL | 0 refills | Status: AC

## 2018-04-30 MED ORDER — HYDROCODONE-HOMATROPINE 5-1.5 MG/5ML PO SYRP: 5.0000 mL | ORAL_SOLUTION | Freq: Three times a day (TID) | ORAL | 0 refills | Status: DC | PRN

## 2018-04-30 MED ORDER — ALBUTEROL SULFATE HFA 108 (90 BASE) MCG/ACT IN AERS: 1.0000 | INHALATION_SPRAY | Freq: Four times a day (QID) | RESPIRATORY_TRACT | 0 refills | Status: DC | PRN

## 2018-04-30 MED ORDER — AZITHROMYCIN 250 MG PO TABS: 250.0000 mg | ORAL_TABLET | Freq: Every day | ORAL | 0 refills | Status: AC

## 2018-04-30 NOTE — ED Provider Notes (Signed)
[facility]    CSN: [id number] Arrival date & time: 04/30/18  1341     History   Chief Complaint Chief Complaint  Patient presents with  . Cough    HPI Suzanne Atkinson is a 37 y.o. female history of GERD presenting today for evaluation of a cough.  Patient states that she has had cough, congestion and chills.  She has had a mild sore throat.  She has had symptoms for approximately 4 days.  She has had associated headache and neck discomfort.  She denies history of smoking and asthma.  She has tried ibuprofen and Sudafed.  She denies any fevers.  Cough is been persistent, and is the main complaint.  HPI  Past Medical History:  Diagnosis Date  . Acid reflux   . DUB (dysfunctional uterine bleeding)     Patient Active Problem List   Diagnosis Date Noted  . Slurred speech 09/29/2012  . Acid reflux     Past Surgical History:  Procedure Laterality Date  . None      OB History   No obstetric history on file.      Home Medications    Prior to Admission medications   Medication Sig Start Date End Date Taking? Authorizing Provider  albuterol (PROVENTIL HFA;VENTOLIN HFA) 108 (90 Base) MCG/ACT inhaler Inhale 1-2 puffs into the lungs every 6 (six) hours as needed for wheezing or shortness of breath. 04/30/18   Kaiden Dardis C, PA-C  azithromycin (ZITHROMAX) 250 MG tablet Take 1 tablet (250 mg total) by mouth daily for 5 days. Take first 2 tablets together, then 1 every day until finished. 05/03/18 05/08/18  Claudene Gatliff C, PA-C  Cetirizine HCl 10 MG CAPS Take 1 capsule (10 mg total) by mouth daily for 10 days. 04/30/18 05/10/18  Kamrynn Melott C, PA-C  fluticasone (FLONASE) 50 MCG/ACT nasal spray Place 1-2 sprays into both nostrils daily. 04/30/18   Alzina Golda C, PA-C  HYDROcodone-homatropine (HYCODAN) 5-1.5 MG/5ML syrup Take 5 mLs by mouth every 8 (eight) hours as needed for cough. 04/30/18   Perle Gibbon C, PA-C  levonorgestrel (MIRENA) 20 MCG/24HR  IUD 1 each by Intrauterine route once.    [provider]  predniSONE (DELTASONE) 50 MG tablet Take 1 tablet (50 mg total) by mouth daily with breakfast for 5 days. 04/30/18 05/05/18  Kol Consuegra, Elesa Hacker, PA-C    Family History Family History  Problem Relation Age of Onset  . Cancer - Colon Father   . High blood pressure Mother   . High blood pressure Maternal Grandmother     Social History Social History   Tobacco Use  . Smoking status: Never Smoker  . Smokeless tobacco: Never Used  Substance Use Topics  . Alcohol use: Yes    Alcohol/week: 1.0 standard drinks    Types: 1 Glasses of wine per week    Comment: Once a month  . Drug use: No     Allergies   Ceclor [cefaclor]   Review of Systems Review of Systems  Constitutional: Positive for chills. Negative for activity change, appetite change, fatigue and fever.  HENT: Positive for congestion, rhinorrhea and sore throat. Negative for ear pain, sinus pressure and trouble swallowing.   Eyes: Negative for discharge and redness.  Respiratory: Positive for cough, shortness of breath and wheezing. Negative for chest tightness.   Cardiovascular: Negative for chest pain.  Gastrointestinal: Negative for abdominal pain, diarrhea, nausea and vomiting.  Musculoskeletal: Negative for myalgias.  Skin: Negative for rash.  Neurological: Positive for headaches. Negative for dizziness and light-headedness.     Physical Exam Triage Vital Signs ED Triage Vitals  Enc Vitals Group     BP 04/30/18 1352 120/85     Pulse Rate 04/30/18 1352 (!) 103     Resp 04/30/18 1352 (!) 22     Temp 04/30/18 1352 98.2 F (36.8 C)     Temp Source 04/30/18 1352 Oral     SpO2 04/30/18 1352 97 %     Weight --      Height --      Head Circumference --      Peak Flow --      Pain Score 04/30/18 1354 7     Pain Loc --      Pain Edu? --      Excl. in [place]? --    No data found.  Updated Vital Signs BP 120/85 (BP Location: Left Arm)   Pulse (!)  103   Temp 98.2 F (36.8 C) (Oral)   Resp (!) 22   SpO2 97%   Visual Acuity Right Eye Distance:   Left Eye Distance:   Bilateral Distance:    Right Eye Near:   Left Eye Near:    Bilateral Near:     Physical Exam Vitals signs and nursing note reviewed.  Constitutional:      General: She is not in acute distress.    Appearance: She is well-developed.  HENT:     Head: Normocephalic and atraumatic.     Ears:     Comments: Bilateral ears without tenderness to palpation of external auricle, tragus and mastoid, EAC's without erythema or swelling, TM's with good bony landmarks and cone of light. Non erythematous.    Nose:     Comments: Nasal mucosa erythematous    Mouth/Throat:     Comments: Oral mucosa pink and moist, no tonsillar enlargement or exudate. Posterior pharynx patent and nonerythematous, no uvula deviation or swelling. Normal phonation. Eyes:     Conjunctiva/sclera: Conjunctivae normal.  Neck:     Musculoskeletal: Neck supple.  Cardiovascular:     Rate and Rhythm: Normal rate and regular rhythm.     Heart sounds: No murmur.  Pulmonary:     Effort: Pulmonary effort is normal. No respiratory distress.     Comments: Mild expiratory wheezing throughout bilateral lung fields Abdominal:     Palpations: Abdomen is soft.     Tenderness: There is no abdominal tenderness.  Skin:    General: Skin is warm and dry.  Neurological:     Mental Status: She is alert.      UC Treatments / Results  Labs (all labs ordered are listed, but only abnormal results are displayed) Labs Reviewed - No data to display  EKG None  Radiology No results found.  Procedures Procedures (including critical care time)  Medications Ordered in UC Medications - No data to display  Initial Impression / Assessment and Plan / UC Course  I have reviewed the triage vital signs and the nursing notes.  Pertinent labs & imaging results that were available during my care of the patient were  reviewed by me and considered in my medical decision making (see chart for details).     Patient with wheezing, URI symptoms x4 days.  Will treat for bronchitis.  Prednisone daily to help with wheezing, cough and shortness of breath.  Albuterol inhaler as needed.  Zyrtec and Flonase to help with nasal congestion and drainage.  Hycodan cough  syrup to help with cough and sleep.  Discussed drowsiness regarding this.  Provided prescription for azithromycin to fill in 3 to 4 days if not having any improvement with recommendations.  Continue to monitor symptoms, temperature, breathing,Discussed strict return precautions. Patient verbalized understanding and is agreeable with plan.  Final Clinical Impressions(s) / UC Diagnoses   Final diagnoses:  Acute bronchitis, unspecified organism     Discharge Instructions     We are treating you for bronchitis Please begin prednisone daily, please take in the morning with food if you are able Please use albuterol inhaler 1 to 2 puffs as needed every 4-6 hours for shortness of breath, wheezing-attached info on how to use an inhaler Please also begin daily allergy pill like Zyrtec or Claritin, I sent in the generic of Zyrtec for you to begin taking Flonase nasal spray 1 to 2 sprays in each nostril daily Both Zyrtec and Flonase you may get over-the-counter if your insurance does not cover, please do whichever is cheaper for you I have a printed prescription for Hycodan cough syrup, please limit this to at home or bedtime as this will cause drowsiness.  Do not drive or work after taking. You may fill his prescription for azithromycin on Monday if you are not having any improvement in your symptoms with the above over the next 3 to 4 days.  Please continue to monitor your symptoms, temperature and breathing, please follow-up if symptoms worsening, developing fevers, shortness of breath, difficulty breathing, chest discomfort, persistent symptoms.   ED  Prescriptions    Medication Sig Dispense Auth. Provider   albuterol (PROVENTIL HFA;VENTOLIN HFA) 108 (90 Base) MCG/ACT inhaler Inhale 1-2 puffs into the lungs every 6 (six) hours as needed for wheezing or shortness of breath. 1 Inhaler Edythe Riches C, PA-C   predniSONE (DELTASONE) 50 MG tablet Take 1 tablet (50 mg total) by mouth daily with breakfast for 5 days. 5 tablet Kebra Lowrimore C, PA-C   Cetirizine HCl 10 MG CAPS Take 1 capsule (10 mg total) by mouth daily for 10 days. 10 capsule Ruthell Feigenbaum C, PA-C   fluticasone (FLONASE) 50 MCG/ACT nasal spray Place 1-2 sprays into both nostrils daily. 1 g Junior Kenedy C, PA-C   azithromycin (ZITHROMAX) 250 MG tablet Take 1 tablet (250 mg total) by mouth daily for 5 days. Take first 2 tablets together, then 1 every day until finished. 6 tablet Marijah Larranaga C, PA-C   HYDROcodone-homatropine (HYCODAN) 5-1.5 MG/5ML syrup Take 5 mLs by mouth every 8 (eight) hours as needed for cough. 70 mL Jordi Kamm C, PA-C     Controlled Substance Prescriptions [place] Controlled Substance Registry consulted? No   Janith Lima, [place] 04/30/18 1544

## 2018-04-30 NOTE — Discharge Instructions (Addendum)
We are treating you for bronchitis Please begin prednisone daily, please take in the morning with food if you are able Please use albuterol inhaler 1 to 2 puffs as needed every 4-6 hours for shortness of breath, wheezing-attached info on how to use an inhaler Please also begin daily allergy pill like Zyrtec or Claritin, I sent in the generic of Zyrtec for you to begin taking Flonase nasal spray 1 to 2 sprays in each nostril daily Both Zyrtec and Flonase you may get over-the-counter if your insurance does not cover, please do whichever is cheaper for you I have a printed prescription for Hycodan cough syrup, please limit this to at home or bedtime as this will cause drowsiness.  Do not drive or work after taking. You may fill his prescription for azithromycin on Monday if you are not having any improvement in your symptoms with the above over the next 3 to 4 days.  Please continue to monitor your symptoms, temperature and breathing, please follow-up if symptoms worsening, developing fevers, shortness of breath, difficulty breathing, chest discomfort, persistent symptoms.

## 2018-04-30 NOTE — ED Triage Notes (Signed)
Pt presents to [facility] for assessment of cough, congestion, chills, sore throat x 4 days.

## 2018-04-30 NOTE — ED Notes (Signed)
Patient able to ambulate independently  

## 2018-05-10 ENCOUNTER — Ambulatory Visit
Admission: EM | Admit: 2018-05-10 | Discharge: 2018-05-10 | Disposition: A | Discharge status: Home / Self Care | Payer: 59 | Attending: Family Medicine | Admitting: Family Medicine

## 2018-05-10 ENCOUNTER — Encounter: Payer: Self-pay | Admitting: Emergency Medicine

## 2018-05-10 DIAGNOSIS — R059 Cough, unspecified: Secondary | ICD-10-CM

## 2018-05-10 DIAGNOSIS — R062 Wheezing: Secondary | ICD-10-CM | POA: Diagnosis not present

## 2018-05-10 DIAGNOSIS — R0981 Nasal congestion: Secondary | ICD-10-CM

## 2018-05-10 DIAGNOSIS — R05 Cough: Secondary | ICD-10-CM

## 2018-05-10 MED ORDER — PREDNISONE 10 MG (48) PO TBPK: ORAL_TABLET | ORAL | 0 refills | Status: DC

## 2018-05-10 NOTE — ED Notes (Signed)
Patient able to ambulate independently  

## 2018-05-10 NOTE — ED Triage Notes (Signed)
Pt presents to [facility] for assessment of continued cough, congestion, chills, and slight sore throat since last visit.  "I want to be sure it's nothing serious".

## 2018-05-10 NOTE — ED Provider Notes (Signed)
[place]   [id number] 05/10/18 Arrival Time: 2119  ASSESSMENT & PLAN:  1. Cough   2. Wheezing   3. Nasal congestion    Will try an extended course of prednisone. Meds ordered this encounter  Medications  . predniSONE (STERAPRED UNI-PAK 48 TAB) 10 MG (48) TBPK tablet    Sig: Take as directed.    Dispense:  48 tablet    Refill:  0   Doubt sinusitis at this time. Finished azithromycin two days ago. Discussed.  OTC symptom care as needed. Ensure adequate fluid intake and rest. May f/u with PCP or here if not seeing steady improvement over the next week or shortly after.  Reviewed expectations re: course of current medical issues. Questions answered. Outlined signs and symptoms indicating need for more acute intervention. Patient verbalized understanding. After Visit Summary given.   SUBJECTIVE: History from: patient.  Suzanne Atkinson is a 38 y.o. female who was seen here on 04/30/2018. Note reviewed. Has taken all medications prescribed. No worsening. Continued coughing; non-productive. No SOB. Does feel like she is wheezing at times; inhaler provides temporary help. Continued nasal congestion and post nasal drainage. No n/v. Tolerating PO intake. Able to go to work. No specific or significant aggravating or alleviating factors reported. OTC treatment: none reported.  Social History   Tobacco Use  Smoking Status Never Smoker  Smokeless Tobacco Never Used   ROS: As per HPI. All other systems negative.   OBJECTIVE:  Vitals:   05/10/18 1433  BP: 127/84  Pulse: (!) 57  Resp: 18  Temp: (!) 97.2 F (36.2 C)  TempSrc: Oral  SpO2: 98%    General appearance: alert; no distress HEENT: nasal congestion; throat with mild cobblestoning; TMs normal; conjunctivae without injection or drainage; no significant tenderness to palpation over frontal or maxillary sinuses Neck: supple without LAD CV: RRR Lungs: unlabored respirations, symmetrical air entry without  wheezing; cough: mild Abd: soft Ext: no LE edema Skin: warm and dry Psychological: alert and cooperative; normal mood and affect   Allergies  Allergen Reactions  . Ceclor [Cefaclor]     As a baby    Past Medical History:  Diagnosis Date  . Acid reflux   . DUB (dysfunctional uterine bleeding)    Family History  Problem Relation Age of Onset  . Cancer - Colon Father   . High blood pressure Mother   . High blood pressure Maternal Grandmother    Social History   Socioeconomic History  . Marital status: Married    Spouse name: Fransisco Beau  . Number of children: 0  . Years of education: 41  . Highest education level: Not on file  Occupational History  . 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 Financial resource strain: Not on file  . Food insecurity:    Worry: Not on file    Inability: Not on file  . Transportation needs:    Medical: Not on file    Non-medical: Not on file  Tobacco Use  . Smoking status: Never Smoker  . Smokeless tobacco: Never Used  Substance and Sexual Activity  . Alcohol use: Yes    Alcohol/week: 1.0 standard drinks    Types: 1 Glasses of wine per week    Comment: Once a month  . Drug use: No  . Sexual activity: Not on file  Lifestyle  . Physical activity:    Days per week: Not on file    Minutes per  session: Not on file  . Stress: Not on file  Relationships  . Social connections:    Talks on phone: Not on file    Gets together: Not on file    Attends religious service: Not on file    Active member of club or organization: Not on file    Attends meetings of clubs or organizations: Not on file    Relationship status: Not on file  . Intimate partner violence:    Fear of current or ex partner: Not on file    Emotionally abused: Not on file    Physically abused: Not on file    Forced sexual activity: Not on file  Other Topics Concern  . Not on file  Social History Narrative   Patient lives at home  with husband Fransisco Beau). 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  Patient has a high school education. Right handed.           Vanessa Kick, MD 05/10/18 1504

## 2018-06-08 ENCOUNTER — Ambulatory Visit (INDEPENDENT_AMBULATORY_CARE_PROVIDER_SITE_OTHER): Payer: 59 | Admitting: Internal Medicine

## 2018-06-08 ENCOUNTER — Encounter: Payer: Self-pay | Admitting: Internal Medicine

## 2018-06-08 VITALS — BP 110/78 | HR 78 | Temp 98.0°F | Ht 65.0 in | Wt 178.8 lb

## 2018-06-08 DIAGNOSIS — Z114 Encounter for screening for human immunodeficiency virus [HIV]: Secondary | ICD-10-CM | POA: Diagnosis not present

## 2018-06-08 DIAGNOSIS — Z23 Encounter for immunization: Secondary | ICD-10-CM

## 2018-06-08 DIAGNOSIS — Z0000 Encounter for general adult medical examination without abnormal findings: Secondary | ICD-10-CM | POA: Diagnosis not present

## 2018-06-08 DIAGNOSIS — E663 Overweight: Secondary | ICD-10-CM | POA: Diagnosis not present

## 2018-06-08 NOTE — Progress Notes (Signed)
New Patient Office Visit     CC/Reason for Visit: establish care/preventative care Previous PCP: unknown Last Visit: many years ago  HPI: Suzanne Atkinson is a 38 y.o. female who is coming in today for the above mentioned reasons.  Patient is married, with no kids and works as a Research scientist (physical sciences) at [employer]. Past Medical History is significant for dirrahea.  She is currently seeing GI and had a colonoscopy last year and will have a repeat next year.  Her father passed of colon cancer.  She sees GYN at [facility] here in [place] to get pap smears.  She is due for a dental screening and an eye exam.  She will receive a flu vaccine today.  Tetanus is UTD.   Past Medical/Surgical History: Past Medical History:  Diagnosis Date  . Acid reflux   . Diarrhea   . DUB (dysfunctional uterine bleeding)   . Sciatica   . UTI (urinary tract infection)     Past Surgical History:  Procedure Laterality Date  . None      Social History:  reports that she has never smoked. She has never used smokeless tobacco. She reports current alcohol use of about 1.0 standard drinks of alcohol per week. She reports that she does not use drugs.  Allergies: Allergies  Allergen Reactions  . Ceclor [Cefaclor]     As a baby    Family History:  Family History  Problem Relation Age of Onset  . Cancer - Colon Father   . High blood pressure Mother   . High blood pressure Maternal Grandmother      Current Outpatient Medications:  .  levonorgestrel (MIRENA) 20 MCG/24HR IUD, 1 each by Intrauterine route once., Disp: , Rfl:   Review of Systems:  Constitutional: Denies fever, chills, diaphoresis, appetite change and fatigue.  HEENT: Denies photophobia, eye pain, redness, hearing loss, ear pain, congestion, sore throat, rhinorrhea, sneezing, mouth sores, trouble swallowing, neck pain, neck stiffness and tinnitus.   Respiratory: Denies SOB, DOE, cough, chest tightness,  and wheezing.     Cardiovascular: Denies chest pain, palpitations and leg swelling.  Gastrointestinal: Denies nausea, vomiting, abdominal pain, diarrhea, constipation, blood in stool and abdominal distention.  Genitourinary: Denies dysuria, urgency, frequency, hematuria, flank pain and difficulty urinating.  Endocrine: Denies: hot or cold intolerance, sweats, changes in hair or nails, polyuria, polydipsia. Musculoskeletal: Denies myalgias, back pain, joint swelling, arthralgias and gait problem.  Skin: Denies pallor, rash and wound.  Neurological: Denies dizziness, seizures, syncope, weakness, light-headedness, numbness and headaches.  Hematological: Denies adenopathy. Easy bruising, personal or family bleeding history  Psychiatric/Behavioral: Denies suicidal ideation, mood changes, confusion, nervousness, sleep disturbance and agitation   Physical Exam: Vitals:   06/08/18 1450  BP: 110/78  Pulse: 78  Temp: 98 F (36.7 C)  TempSrc: Oral  SpO2: 99%  Weight: 178 lb 12.8 oz (81.1 kg)  Height: 5' 5"  (1.651 m)   Body mass index is 29.75 kg/m.  Constitutional: NAD, calm, comfortable Eyes: PERRL, lids and conjunctivae normal Respiratory: clear to auscultation bilaterally, no wheezing, no crackles. Normal respiratory effort. No accessory muscle use.  Cardiovascular: Regular rate and rhythm, no murmurs / rubs / gallops. No extremity edema. 2+ pedal pulses.  Psychiatric: Normal judgment and insight. Alert and oriented x 3. Normal mood.    Impression and Plan:  Preventative health care -refer to Dental cleaning & Eye exam -she will receive flu vaccine today; Flu Vaccine QUAD 6+ mos PF IM (Fluarix Quad PF) -  cont to follow up with GI, due for another scope next year -GYN for cervical cancer screening -Mammogram starting at age 70.  Encounter for screening for HIV  - HIV antibody (with reflex)  Overweight (BMI 25.0-29.9) -  -Discussed lifestyle modifications.     Patient Instructions  Doristine Devoid to  meet you today.  Instructions: -you will receive your flu shot today -come back when you can to get blood work drawn, make sure you are fasting -schedule follow up in 1 year or as needed   Preventive Care 18-39 Years, Female Preventive care refers to lifestyle choices and visits with your health care provider that can promote health and wellness. What does preventive care include?   A yearly physical exam. This is also called an annual well check.  Dental exams once or twice a year.  Routine eye exams. Ask your health care provider how often you should have your eyes checked.  Personal lifestyle choices, including: ? Daily care of your teeth and gums. ? Regular physical activity. ? Eating a healthy diet. ? Avoiding tobacco and drug use. ? Limiting alcohol use. ? Practicing safe sex. ? Taking vitamin and mineral supplements as recommended by your health care provider. What happens during an annual well check? The services and screenings done by your health care provider during your annual well check will depend on your age, overall health, lifestyle risk factors, and family history of disease. Counseling Your health care provider may ask you questions about your:  Alcohol use.  Tobacco use.  Drug use.  Emotional well-being.  Home and relationship well-being.  Sexual activity.  Eating habits.  Work and work Statistician.  Method of birth control.  Menstrual cycle.  Pregnancy history. Screening You may have the following tests or measurements:  Height, weight, and BMI.  Diabetes screening. This is done by checking your blood sugar (glucose) after you have not eaten for a while (fasting).  Blood pressure.  Lipid and cholesterol levels. These may be checked every 5 years starting at age 98.  Skin check.  Hepatitis C blood test.  Hepatitis B blood test.  Sexually transmitted disease (STD) testing.  BRCA-related cancer screening. This may be done if you  have a family history of breast, ovarian, tubal, or peritoneal cancers.  Pelvic exam and Pap test. This may be done every 3 years starting at age 61. Starting at age 36, this may be done every 5 years if you have a Pap test in combination with an HPV test. Discuss your test results, treatment options, and if necessary, the need for more tests with your health care provider. Vaccines Your health care provider may recommend certain vaccines, such as:  Influenza vaccine. This is recommended every year.  Tetanus, diphtheria, and acellular pertussis (Tdap, Td) vaccine. You may need a Td booster every 10 years.  Varicella vaccine. You may need this if you have not been vaccinated.  HPV vaccine. If you are 45 or younger, you may need three doses over 6 months.  Measles, mumps, and rubella (MMR) vaccine. You may need at least one dose of MMR. You may also need a second dose.  Pneumococcal 13-valent conjugate (PCV13) vaccine. You may need this if you have certain conditions and were not previously vaccinated.  Pneumococcal polysaccharide (PPSV23) vaccine. You may need one or two doses if you smoke cigarettes or if you have certain conditions.  Meningococcal vaccine. One dose is recommended if you are age 39-21 years and a Market researcher living  in a residence hall, or if you have one of several medical conditions. You may also need additional booster doses.  Hepatitis A vaccine. You may need this if you have certain conditions or if you travel or work in places where you may be exposed to hepatitis A.  Hepatitis B vaccine. You may need this if you have certain conditions or if you travel or work in places where you may be exposed to hepatitis B.  Haemophilus influenzae type b (Hib) vaccine. You may need this if you have certain risk factors. Talk to your health care provider about which screenings and vaccines you need and how often you need them. This information is not intended to  replace advice given to you by your health care provider. Make sure you discuss any questions you have with your health care provider. Document Released: 06/17/2001 Document Revised: 12/02/2016 Document Reviewed: 02/20/2015 Elsevier Interactive Patient Education  2019 [place], RN DNP student [facility] [employer]

## 2018-06-08 NOTE — Patient Instructions (Addendum)
Great to meet you today.  Instructions: -you will receive your flu shot today -come back when you can to get blood work drawn, make sure you are fasting -schedule follow up in 1 year or as needed   Preventive Care 18-39 Years, Female Preventive care refers to lifestyle choices and visits with your health care provider that can promote health and wellness. What does preventive care include?   A yearly physical exam. This is also called an annual well check.  Dental exams once or twice a year.  Routine eye exams. Ask your health care provider how often you should have your eyes checked.  Personal lifestyle choices, including: ? Daily care of your teeth and gums. ? Regular physical activity. ? Eating a healthy diet. ? Avoiding tobacco and drug use. ? Limiting alcohol use. ? Practicing safe sex. ? Taking vitamin and mineral supplements as recommended by your health care provider. What happens during an annual well check? The services and screenings done by your health care provider during your annual well check will depend on your age, overall health, lifestyle risk factors, and family history of disease. Counseling Your health care provider may ask you questions about your:  Alcohol use.  Tobacco use.  Drug use.  Emotional well-being.  Home and relationship well-being.  Sexual activity.  Eating habits.  Work and work Statistician.  Method of birth control.  Menstrual cycle.  Pregnancy history. Screening You may have the following tests or measurements:  Height, weight, and BMI.  Diabetes screening. This is done by checking your blood sugar (glucose) after you have not eaten for a while (fasting).  Blood pressure.  Lipid and cholesterol levels. These may be checked every 5 years starting at age 8.  Skin check.  Hepatitis C blood test.  Hepatitis B blood test.  Sexually transmitted disease (STD) testing.  BRCA-related cancer screening. This may be  done if you have a family history of breast, ovarian, tubal, or peritoneal cancers.  Pelvic exam and Pap test. This may be done every 3 years starting at age 81. Starting at age 59, this may be done every 5 years if you have a Pap test in combination with an HPV test. Discuss your test results, treatment options, and if necessary, the need for more tests with your health care provider. Vaccines Your health care provider may recommend certain vaccines, such as:  Influenza vaccine. This is recommended every year.  Tetanus, diphtheria, and acellular pertussis (Tdap, Td) vaccine. You may need a Td booster every 10 years.  Varicella vaccine. You may need this if you have not been vaccinated.  HPV vaccine. If you are 65 or younger, you may need three doses over 6 months.  Measles, mumps, and rubella (MMR) vaccine. You may need at least one dose of MMR. You may also need a second dose.  Pneumococcal 13-valent conjugate (PCV13) vaccine. You may need this if you have certain conditions and were not previously vaccinated.  Pneumococcal polysaccharide (PPSV23) vaccine. You may need one or two doses if you smoke cigarettes or if you have certain conditions.  Meningococcal vaccine. One dose is recommended if you are age 87-21 years and a first-year college student living in a residence hall, or if you have one of several medical conditions. You may also need additional booster doses.  Hepatitis A vaccine. You may need this if you have certain conditions or if you travel or work in places where you may be exposed to hepatitis A.  Hepatitis  B vaccine. You may need this if you have certain conditions or if you travel or work in places where you may be exposed to hepatitis B.  Haemophilus influenzae type b (Hib) vaccine. You may need this if you have certain risk factors. Talk to your health care provider about which screenings and vaccines you need and how often you need them. This information is not  intended to replace advice given to you by your health care provider. Make sure you discuss any questions you have with your health care provider. Document Released: 06/17/2001 Document Revised: 12/02/2016 Document Reviewed: 02/20/2015 Elsevier Interactive Patient Education  2019 [employer].

## 2018-06-10 ENCOUNTER — Other Ambulatory Visit (INDEPENDENT_AMBULATORY_CARE_PROVIDER_SITE_OTHER): Payer: 59

## 2018-06-10 DIAGNOSIS — E663 Overweight: Secondary | ICD-10-CM | POA: Diagnosis not present

## 2018-06-10 LAB — COMPREHENSIVE METABOLIC PANEL
ALT: 20 U/L (ref 0–35)
AST: 17 U/L (ref 0–37)
Albumin: 4.3 g/dL (ref 3.5–5.2)
Alkaline Phosphatase: 44 U/L (ref 39–117)
BUN: 14 mg/dL (ref 6–23)
CO2: 28 mEq/L (ref 19–32)
Calcium: 9.1 mg/dL (ref 8.4–10.5)
Chloride: 102 mEq/L (ref 96–112)
Creatinine, Ser: 0.62 mg/dL (ref 0.40–1.20)
GFR: 107.07 mL/min (ref >60.00)
Glucose, Bld: 67 mg/dL — ABNORMAL LOW (ref 70–99)
Potassium: 4.3 mEq/L (ref 3.5–5.1)
Sodium: 137 mEq/L (ref 135–145)
Total Bilirubin: 0.3 mg/dL (ref 0.2–1.2)
Total Protein: 6.8 g/dL (ref 6.0–8.3)

## 2018-06-10 LAB — CBC WITH DIFFERENTIAL/PLATELET
Basophils Absolute: 0 10*3/uL (ref 0.0–0.1)
Basophils Relative: 0.3 % (ref 0.0–3.0)
Eosinophils Absolute: 0.1 10*3/uL (ref 0.0–0.7)
Eosinophils Relative: 1.2 % (ref 0.0–5.0)
HCT: 45.2 % (ref 36.0–46.0)
Hemoglobin: 15.4 g/dL — ABNORMAL HIGH (ref 12.0–15.0)
Lymphocytes Relative: 22.9 % (ref 12.0–46.0)
Lymphs Abs: 1.1 10*3/uL (ref 0.7–4.0)
MCHC: 34.2 g/dL (ref 30.0–36.0)
MCV: 95.7 fl (ref 78.0–100.0)
Monocytes Absolute: 0.4 10*3/uL (ref 0.1–1.0)
Monocytes Relative: 9.3 % (ref 3.0–12.0)
Neutro Abs: 3.1 10*3/uL (ref 1.4–7.7)
Neutrophils Relative %: 66.3 % (ref 43.0–77.0)
Platelets: 252 10*3/uL (ref 150.0–400.0)
RBC: 4.73 Mil/uL (ref 3.87–5.11)
RDW: 13.9 % (ref 11.5–15.5)
WBC: 4.7 10*3/uL (ref 4.0–10.5)

## 2018-06-10 LAB — LIPID PANEL
Cholesterol: 254 mg/dL — ABNORMAL HIGH (ref 0–200)
HDL: 65.8 mg/dL (ref >39.00)
LDL Cholesterol: 176 mg/dL — ABNORMAL HIGH (ref 0–99)
NonHDL: 187.73
Total CHOL/HDL Ratio: 4
Triglycerides: 57 mg/dL (ref 0.0–149.0)
VLDL: 11.4 mg/dL (ref 0.0–40.0)

## 2018-06-10 LAB — TSH: TSH: 1.05 u[IU]/mL (ref 0.35–4.50)

## 2018-06-10 LAB — HEMOGLOBIN A1C: Hgb A1c MFr Bld: 5.4 % (ref 4.6–6.5)

## 2018-07-06 DIAGNOSIS — K580 Irritable bowel syndrome with diarrhea: Secondary | ICD-10-CM | POA: Diagnosis not present

## 2018-07-06 DIAGNOSIS — D121 Benign neoplasm of appendix: Secondary | ICD-10-CM | POA: Diagnosis not present

## 2018-08-04 ENCOUNTER — Ambulatory Visit: Payer: Self-pay | Admitting: Internal Medicine

## 2018-08-04 NOTE — Telephone Encounter (Signed)
I just wondered if I have the coronavirus.   I have allergies.   Ear pressure, throat is scratchy,  no fever or shortness of breath, or cough.  See triage notes.  I let her know she did not meet the criteria for the coronavirus.   She did not have any risk factors.    I suggested she may want to try a OTC antihistamine like Allegra, Zrytec or Claritin for her allergies.   She said she would try that and thanked me for my help.   She was relieved to know she didn't meet the criteria for the coronavirus.  I encouraged her to call back with any questions.  She was agreeable to this.          Reason for Disposition . COVID-19, questions about  Answer Assessment - Initial Assessment Questions 1. COVID-19 DIAGNOSIS: "Who made your Coronavirus (COVID-19) diagnosis?" "Was it confirmed by a positive lab test?" If not diagnosed by a HCP, ask "Are there lots of cases (community spread) where you live?" (See public health department website, if unsure)   * MAJOR community spread: high number of cases; numbers of cases are increasing; many people hospitalized.   * MINOR community spread: low number of cases; not increasing; few or no people hospitalized     I just wondered if I have the coronavirus.   2. ONSET: "When did the COVID-19 symptoms start?"      I'm having allergies.    3. WORST SYMPTOM: "What is your worst symptom?" (e.g., cough, fever, shortness of breath, muscle aches)     Pressure in my ears, scratchy throat, no cough or fever.  No shortness of breath 4. COUGH: "How bad is the cough?"       No cough just on occasion to clear my throat. 5. FEVER: "Do you have a fever?" If so, ask: "What is your temperature, how was it measured, and when did it start?"     No.  I don't feel like it.   I don't have a thermometer. 6. RESPIRATORY STATUS: "Describe your breathing?" (e.g., shortness of breath, wheezing, unable to speak)      Fine. 7. BETTER-SAME-WORSE: "Are you getting better, staying the  same or getting worse compared to yesterday?"  If getting worse, ask, "In what way?"     Same  I think it's my allergies but I just wanted to make sure. 8. HIGH RISK DISEASE: "Do you have any chronic medical problems?" (e.g., asthma, heart or lung disease, weak immune system, etc.)     Not asked 9. PREGNANCY: "Is there any chance you are pregnant?" "When was your last menstrual period?"     Not asked 10. OTHER SYMPTOMS: "Do you have any other symptoms?"  (e.g., runny nose, headache, sore throat, loss of smell)       No runny nose just the scratchy throat, ear pressure and needing to clear my throat.  Protocols used: CORONAVIRUS (COVID-19) DIAGNOSED OR SUSPECTED-A-AH

## 2018-08-07 ENCOUNTER — Telehealth: Payer: 59 | Admitting: Physician Assistant

## 2018-08-07 DIAGNOSIS — J069 Acute upper respiratory infection, unspecified: Secondary | ICD-10-CM

## 2018-08-07 DIAGNOSIS — B9789 Other viral agents as the cause of diseases classified elsewhere: Principal | ICD-10-CM

## 2018-08-07 NOTE — Progress Notes (Signed)
I have spent 5 minutes in review of e-visit questionnaire, review and updating patient chart, medical decision making and response to patient.   Teighan Aubert Cody Hinda Lindor, PA-C    

## 2018-08-07 NOTE — Progress Notes (Signed)
E-Visit for Corona Virus Screening  Based on your current symptoms, it seems unlikely that your symptoms are related to the Coronavirus.   That being said for anyone with symptoms of a viral illness currently we are asking that you stay home until feeling better and at least 7 days since onset of symptoms + no fever for 72 hours without medication before returning to work.  See information on Coronavirus below and a list of other home measures to take to help with symptoms.   Coronavirus disease 2019 (COVID-19) is a respiratory illness that can spread from person to person. The virus that causes COVID-19 is a new virus that was first identified in the country of [place] but is now found in multiple other countries and has spread to the [place].  Symptoms associated with the virus are mild to severe fever, cough, and shortness of breath. There is currently no vaccine to protect against COVID-19, and there is no specific antiviral treatment for the virus.   To be considered HIGH RISK for Coronavirus (COVID-19), you have to meet the following criteria:  . Traveled to [place], [place], [place], [place] or [place]; or in the [place] to [place], [place], [place], or [place]; and have fever, cough, and shortness of breath within the last 2 weeks of travel OR  . Been in close contact with a person diagnosed with COVID-19 within the last 2 weeks and have fever, cough, and shortness of breath  . IF YOU DO NOT MEET THESE CRITERIA, YOU ARE CONSIDERED LOW RISK FOR COVID-19.   It is vitally important that if you feel that you have an infection such as this virus or any other virus that you stay home and away from places where you may spread it to others.  You should self-quarantine for 14 days if you have symptoms that could potentially be coronavirus and avoid contact with people age 53 and older.   You can use medication such as Delsym for cough. You may also take acetaminophen (Tylenol) as  needed for fever.   Reduce your risk of any infection by using the same precautions used for avoiding the common cold or flu:  Marland Kitchen Wash your hands often with soap and warm water for at least 20 seconds.  If soap and water are not readily available, use an alcohol-based hand sanitizer with at least 60% alcohol.  . If coughing or sneezing, cover your mouth and nose by coughing or sneezing into the elbow areas of your shirt or coat, into a tissue or into your sleeve (not your hands). . Avoid shaking hands with others and consider head nods or verbal greetings only. . Avoid touching your eyes, nose, or mouth with unwashed hands.  . Avoid close contact with people who are sick. . Avoid places or events with large numbers of people in one location, like concerts or sporting events. . Carefully consider travel plans you have or are making. . If you are planning any travel outside or inside the [place], visit the CDC's Travelers' Health webpage for the latest health notices. . If you have some symptoms but not all symptoms, continue to monitor at home and seek medical attention if your symptoms worsen. . If you are having a medical emergency, call 911.  HOME CARE . Only take medications as instructed by your medical team. . Drink plenty of fluids and get plenty of rest. . A steam or ultrasonic humidifier can help if you have congestion.  GET HELP RIGHT AWAY IF: . You develop worsening fever. . You become short of breath . You cough up blood. . Your symptoms become more severe MAKE SURE YOU   Understand these instructions.  Will watch your condition.  Will get help right away if you are not doing well or get worse.  Your e-visit answers were reviewed by a board certified advanced clinical practitioner to complete your personal care plan.  Depending on the condition, your plan could have included both over the counter or prescription medications.  If there is a problem please reply once you have  received a response from your provider. Your safety is important to [place].  If you have drug allergies check your prescription carefully.    You can use MyChart to ask questions about today's visit, request a non-urgent call back, or ask for a work or school excuse for 24 hours related to this e-Visit. If it has been greater than 24 hours you will need to follow up with your provider, or enter a new e-Visit to address those concerns. You will get an e-mail in the next two days asking about your experience.  I hope that your e-visit has been valuable and will speed your recovery. Thank you for using e-visits.

## 2018-08-11 ENCOUNTER — Other Ambulatory Visit: Payer: Self-pay

## 2018-08-11 ENCOUNTER — Ambulatory Visit: Payer: Self-pay | Admitting: Internal Medicine

## 2018-08-11 ENCOUNTER — Ambulatory Visit (INDEPENDENT_AMBULATORY_CARE_PROVIDER_SITE_OTHER): Payer: 59 | Admitting: Family Medicine

## 2018-08-11 DIAGNOSIS — J301 Allergic rhinitis due to pollen: Secondary | ICD-10-CM

## 2018-08-11 NOTE — Telephone Encounter (Signed)
Pt had an E visit with Raiford Noble on 08/07/2018.   She is having the same symptoms.   Stuffy nose, scratchy throat, bilateral ear pressure.   They told her it was a viral infection as she did not meet criteria for the COVID-19 virus.  She is wondering what to do.   She has missed a week of work because on the E visit she was instructed to stay home until symptoms resolved which they have not. No fever, shortness of breath or coughing.  She is ok with doing an E visit/phone call.  I sent these notes to Dr. Jerilee Hoh for further disposition.    Reason for Disposition . [1] Sinus congestion (pressure, fullness) AND [2] present > 10 days  Answer Assessment - Initial Assessment Questions 1. ONSET: "When did the nasal discharge start?"     I did a E visit.  They told me it was viral.   I have a headache, stuffy nose and a scratchy throat now.   I have not been to work.   They told me to stay home due to the illness being viral.    On Sunday I thought I had a sinus infection.    I have been feeling terrible.  I'm not feeling as tired as I was. I'm still having symptoms. 2. AMOUNT: "How much discharge is there?"      I've been having off and on symptoms.   I had bronchitis back in Dec and Jan.    The change in the weather makes me sick sometimes.    This is lasting longer than usual.  No fever I don't have a thermometer but I haven't felt hot.    3. COUGH: "Do you have a cough?" If yes, ask: "Describe the color of your sputum" (clear, white, yellow, green)     No 4. RESPIRATORY DISTRESS: "Describe your breathing."      No 5. FEVER: "Do you have a fever?" If so, ask: "What is your temperature, how was it measured, and when did it start?"     Don't feel like I do. 6. SEVERITY: "Overall, how bad are you feeling right now?" (e.g., doesn't interfere with normal activities, staying home from school/work, staying in bed)      This is lasting longer than normal for me.   I've been out of work for a  week. 7. OTHER SYMPTOMS: "Do you have any other symptoms?" (e.g., sore throat, earache, wheezing, vomiting)     Pressure in ears, scratchy throat, stuffy throat.   I'm taking Sudefed and Ibuprofen it's not really helping. 8. PREGNANCY: "Is there any chance you are pregnant?" "When was your last menstrual period?"     No not at all.  Protocols used: COMMON COLD-A-AH

## 2018-08-11 NOTE — Telephone Encounter (Signed)
Patient scheduled for a Doxy.me appointment with Dr Elease Hashimoto at 11 am 08/11/2018.

## 2018-08-11 NOTE — Telephone Encounter (Signed)
Left message on machine for patient to return our call to schedule a doxy.me visit.  Dr Jerilee Hoh is out of the office today.

## 2018-08-11 NOTE — Progress Notes (Signed)
Patient ID: Stephonie Wilcoxen, female   DOB: 11-19-1980, 38 y.o.   MRN: [id number]  Virtual Visit via Video Note  I connected with Gertie Fey on 08/11/18 at 11:30 AM EDT by a video enabled telemedicine application and verified that I am speaking with the correct person using two identifiers.  Location patient: home Location provider:work or home office Persons participating in the virtual visit: patient, provider  I discussed the limitations of evaluation and management by telemedicine and the availability of in person appointments. The patient expressed understanding and agreed to proceed.   HPI:  Patient had called with concern for some persistent upper respiratory symptoms.  Last week she developed some headache and sore throat and nasal congestion.  She did an ED visit on 08/07/2018.  Her risk for coronavirus was felt to be very low.  She denies any fever.  No significant cough.  No dyspnea.  She does have some postnasal drip symptoms.  She has some sinus congestion but no purulent secretions.  No fevers or chills.  She has not taken any allergy medications.  No known sick contacts.  No recent travels.   ROS: See pertinent positives and negatives per HPI.  Past Medical History:  Diagnosis Date  . Acid reflux   . Allergy   . Chickenpox   . Colon polyps   . Diarrhea   . DUB (dysfunctional uterine bleeding)   . Sciatica   . UTI (urinary tract infection)     Past Surgical History:  Procedure Laterality Date  . None      Family History  Problem Relation Age of Onset  . Cancer - Colon Father   . High blood pressure Mother   . High blood pressure Maternal Grandmother     SOCIAL HX: Non-smoker   Current Outpatient Medications:  .  levonorgestrel (MIRENA) 20 MCG/24HR IUD, 1 each by Intrauterine route once., Disp: , Rfl:   EXAM:  VITALS per patient if applicable:  GENERAL: alert, oriented, appears well and in no acute distress  HEENT: atraumatic, conjunttiva  clear, no obvious abnormalities on inspection of external nose and ears  NECK: normal movements of the head and neck  LUNGS: on inspection no signs of respiratory distress, breathing rate appears normal, no obvious gross SOB, gasping or wheezing  CV: no obvious cyanosis  MS: moves all visible extremities without noticeable abnormality  PSYCH/NEURO: pleasant and cooperative, no obvious depression or anxiety, speech and thought processing grossly intact  ASSESSMENT AND PLAN:  Discussed the following assessment and plan:  Seasonal allergic rhinitis due to pollen.  She does not have any worrisome symptoms such as cough, dyspnea, fever  -Suggest that she try over-the-counter allergy medication such as Claritin and/or Flonase -Follow-up for any fever or any persistent or worsening symptoms     I discussed the assessment and treatment plan with the patient. The patient was provided an opportunity to ask questions and all were answered. The patient agreed with the plan and demonstrated an understanding of the instructions.   The patient was advised to call back or seek an in-person evaluation if the symptoms worsen or if the condition fails to improve as anticipated.  Carolann Littler, MD

## 2018-11-09 ENCOUNTER — Encounter: Payer: Self-pay | Admitting: Family Medicine

## 2018-11-09 ENCOUNTER — Other Ambulatory Visit: Payer: Self-pay

## 2018-11-09 ENCOUNTER — Ambulatory Visit (INDEPENDENT_AMBULATORY_CARE_PROVIDER_SITE_OTHER): Payer: 59 | Admitting: Family Medicine

## 2018-11-09 DIAGNOSIS — J301 Allergic rhinitis due to pollen: Secondary | ICD-10-CM

## 2018-11-09 DIAGNOSIS — K219 Gastro-esophageal reflux disease without esophagitis: Secondary | ICD-10-CM

## 2018-11-09 DIAGNOSIS — R519 Headache, unspecified: Secondary | ICD-10-CM

## 2018-11-09 DIAGNOSIS — R51 Headache: Secondary | ICD-10-CM

## 2018-11-09 MED ORDER — MONTELUKAST SODIUM 10 MG PO TABS: 10.0000 mg | ORAL_TABLET | Freq: Every day | ORAL | 3 refills | Status: DC

## 2018-11-09 NOTE — Progress Notes (Signed)
Virtual Visit via Video Note   I connected with Suzanne Atkinson on 11/09/18 at 10:45 AM EDT by a video enabled telemedicine application and verified that I am speaking with the correct person using two identifiers.  Location patient: home Location provider:home office Persons participating in the virtual visit: patient, provider  I discussed the limitations of evaluation and management by telemedicine and the availability of in person appointments. The patient expressed understanding and agreed to proceed.  Chief Complaint  Patient presents with  . Sore Throat    sore throat/ha x3 months    HPI: Suzanne Peaden is a 38 yo female c/o 3 months of intermittent "scratchy" throat and frontal pressure headache. She is positive symptoms are related to allergies. No daily symptoms. + Nasal congestion and rhinorrhea. Negative for dysphagia and stridor. Frontal pressure headache 1-2/10 dull pain. No associated visual changes,photophobia,or focal deficit.  She is on Flonase nasal spray and Claritin. Denies fever,chills,unusual fatigue,body aches,cough,wheezing,dyspnea,or CP.  Hx of GERD, heartburn exacerbated by coffee mainly.  Denies abdominal pain, nausea, vomiting, changes in bowel habits, blood in stool or melena.   ROS: See pertinent positives and negatives per HPI.  Past Medical History:  Diagnosis Date  . Acid reflux   . Allergy   . Chickenpox   . Colon polyps   . Diarrhea   . DUB (dysfunctional uterine bleeding)   . Sciatica   . UTI (urinary tract infection)     Past Surgical History:  Procedure Laterality Date  . None      Family History  Problem Relation Age of Onset  . Cancer - Colon Father   . High blood pressure Mother   . High blood pressure Maternal Grandmother     Social History   Socioeconomic History  . Marital status: Married    Spouse name: Fransisco Beau  . Number of children: 0  . Years of education: 74  . Highest education level: Not on file  Occupational  History  . Occupation: Programme researcher, broadcasting/film/video: [employer]  Social Needs  . Financial resource strain: Not on file  . Food insecurity    Worry: Not on file    Inability: Not on file  . Transportation needs    Medical: Not on file    Non-medical: Not on file  Tobacco Use  . Smoking status: Never Smoker  . Smokeless tobacco: Never Used  Substance and Sexual Activity  . Alcohol use: Yes    Alcohol/week: 1.0 standard drinks    Types: 1 Glasses of wine per week    Comment: Once a month  . Drug use: No  . Sexual activity: Not on file  Lifestyle  . Physical activity    Days per week: Not on file    Minutes per session: Not on file  . Stress: Not on file  Relationships  . Social Herbalist on phone: Not on file    Gets together: Not on file    Attends religious service: Not on file    Active member of club or organization: Not on file    Attends meetings of clubs or organizations: Not on file    Relationship status: Not on file  . Intimate partner violence    Fear of current or ex partner: Not on file    Emotionally abused: Not on file    Physically abused: Not on file    Forced sexual activity: Not on file  Other Topics Concern  .  Not on file  Social History Narrative   Patient lives at home with husband Fransisco Beau). 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  Patient has a high school education. Right handed.     Current Outpatient Medications:  .  levonorgestrel (MIRENA) 20 MCG/24HR IUD, 1 each by Intrauterine route once., Disp: , Rfl:  .  montelukast (SINGULAIR) 10 MG tablet, Take 1 tablet (10 mg total) by mouth at bedtime., Disp: 30 tablet, Rfl: 3  EXAM:  VITALS per patient if applicable:N/A  GENERAL: alert, oriented, appears well and in no acute distress  HEENT: atraumatic, conjunctiva clear, no obvious abnormalities on inspection of external nose and ears No dysphonia.  NECK: normal movements of the head and  neck  LUNGS: on inspection no signs of respiratory distress, breathing rate appears normal, no obvious gross SOB, gasping or wheezing  CV: no obvious cyanosis  PSYCH/NEURO: pleasant and cooperative, no obvious depression or anxiety, speech and thought processing grossly intact  ASSESSMENT AND PLAN:  Discussed the following assessment and plan:  Seasonal allergic rhinitis due to pollen - Plan: montelukast (SINGULAIR) 10 MG tablet Most likely causing throat discomfort. Stop Claritin and start Cetirizine 10 mg daily. Continue Flonase nasal spray daily prn. Nasal saline irrigations as needed.  Singulair 20 mg at night. F/U with PCP in   Gastroesophageal reflux disease, esophagitis presence not specified - Plan: Could be contributing to throat discomfort. GERD precautions recommended. Consider pharmacologic treatment with PPI if symptoms persist.   Frontal headache - Plan: Most likely related to allergies,other possible causes discussed. Instructed about warning signs. F/U with PCP in 6 weeks,before if needed.   I discussed the assessment and treatment plan with the patient. She was provided an opportunity to ask questions and all were answered. She agreed with the plan and demonstrated an understanding of the instructions.   The patient was advised to call back or seek an in-person evaluation if the symptoms worsen or if the condition fails to improve as anticipated.  Return in about 6 weeks (around 12/21/2018) for with PCP.    Deshunda Thackston Martinique, MD

## 2018-11-15 ENCOUNTER — Encounter: Payer: Self-pay | Admitting: Internal Medicine

## 2018-11-16 ENCOUNTER — Other Ambulatory Visit: Payer: Self-pay | Admitting: Internal Medicine

## 2018-11-25 ENCOUNTER — Ambulatory Visit (INDEPENDENT_AMBULATORY_CARE_PROVIDER_SITE_OTHER): Payer: 59 | Admitting: Family Medicine

## 2018-11-25 ENCOUNTER — Encounter: Payer: Self-pay | Admitting: Family Medicine

## 2018-11-25 ENCOUNTER — Other Ambulatory Visit: Payer: Self-pay

## 2018-11-25 VITALS — Temp 97.1°F

## 2018-11-25 DIAGNOSIS — R51 Headache: Secondary | ICD-10-CM | POA: Diagnosis not present

## 2018-11-25 DIAGNOSIS — J309 Allergic rhinitis, unspecified: Secondary | ICD-10-CM | POA: Diagnosis not present

## 2018-11-25 DIAGNOSIS — R0981 Nasal congestion: Secondary | ICD-10-CM

## 2018-11-25 DIAGNOSIS — R519 Headache, unspecified: Secondary | ICD-10-CM

## 2018-11-25 NOTE — Progress Notes (Signed)
Virtual Visit via Video Note  I connected with Calyn  on 11/25/18 at  3:40 PM EDT by a video enabled telemedicine application and verified that I am speaking with the correct person using two identifiers.  Location patient: home Location provider:work or home office Persons participating in the virtual visit: patient, provider  I discussed the limitations of evaluation and management by telemedicine and the availability of in person appointments. The patient expressed understanding and agreed to proceed.   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  Past Medical History:  Diagnosis Date  . Acid reflux   . Allergy   . Chickenpox   . Colon polyps   . Diarrhea   . DUB (dysfunctional uterine bleeding)   . Sciatica   . UTI (urinary tract infection)     Past Surgical History:  Procedure Laterality Date  . None      Family History  Problem Relation Age of Onset  . Cancer - Colon Father   . High blood pressure Mother   . High blood pressure Maternal Grandmother     SOCIAL HX: see hpi   Current Outpatient Medications:  .  Ascorbic Acid (VITAMIN C PO), Take by mouth., Disp: , Rfl:  .  cetirizine (ZYRTEC) 10 MG tablet, Take 10 mg by mouth daily., Disp: , Rfl:  .   Fluticasone Propionate (FLONASE NA), Place into the nose., Disp: , Rfl:  .  levonorgestrel (MIRENA) 20 MCG/24HR IUD, 1 each by Intrauterine route once., Disp: , Rfl:  .  MAGNESIUM PO, Take by mouth., Disp: , Rfl:  .  montelukast (SINGULAIR) 10 MG tablet, Take 1 tablet (10 mg total) by mouth at bedtime., Disp: 30 tablet, Rfl: 3 .  Multiple Vitamins-Minerals (MULTIVITAMIN ADULTS PO), Take by mouth., Disp: , Rfl:  .  VITAMIN D PO, Take by mouth daily., Disp: , Rfl:   EXAM:  VITALS per patient if applicable:  GENERAL: alert, oriented, appears well and in no acute distress  HEENT: atraumatic, conjunttiva clear, no obvious abnormalities on inspection of external nose and ears  NECK: normal movements of the head and neck  LUNGS: on inspection no signs of respiratory distress, breathing rate appears normal, no obvious gross SOB, gasping or wheezing  CV: no obvious cyanosis  MS: moves all visible extremities without noticeable abnormality  PSYCH/NEURO: pleasant and cooperative, no obvious depression or anxiety, speech and thought processing grossly intact  ASSESSMENT AND PLAN:  Discussed the following assessment and plan:  Sinus congestion - Nonintractable headache, unspecified chronicity pattern, unspecified headache type Allergic rhinitis, unspecified seasonality, unspecified trigger  -we discussed possible serious and likely etiologies, workup and treatment, treatment risks and return precautions. Suspect seasonal allergies and she plans to call [facility] for appt for allergy testing as is already  on 3 medications without relief. Also discussed possibility of viral illness, COVID19 vs other.  -after this discussion, Jadwiga opted for COVID19 testing, self/home quarantine/isolation discussed, testing and limitations discussed, tx for allergies discussed and she agrees to call [facility] and I will place referral incase that is needed.  -follow up advised next week, she prefers  to call back for appt if needed -of course, we advised Mattea  to return or notify a doctor immediately if symptoms worsen or persist or new concerns arise.    I discussed the assessment and treatment plan with the patient. The patient was provided an opportunity to ask questions and all were answered. The patient agreed with the plan and demonstrated an understanding of the instructions.    [name], DO

## 2018-11-25 NOTE — Patient Instructions (Signed)
-We placed a referral for you as discussed to [facility] regarding the chronic allergy issues. It usually takes about 1-2 weeks to process and schedule this referral. If you have not heard from [place] regarding this appointment in 2 weeks please contact our office. You also can call their office to schedule.  We placed an order for Coronavirus testing as we discussed.  Self Isolation/Home Quarantine: -see the CDC site for information:   RunningShows.co.za.html   -STAY HOME except for to seek medical care -stay in your own room away from others in your house and use a separate bathroom if possible -Wash hands frequently, wear a mask if you leave your room and interact as little as possible with others -seek medical care immediately if worsening - call our office for a visit or call ahead if going elsewhere to an urgent care  -seek emergency care if very sick or severe symptoms - call 911 -isolate for at least 10 days from the onset of symptoms PLUS 3 days of no fever PLUS 3 days of improving symptoms    Novel Coronavirus Testing: I sent an order for coronavirus testing. No Appointment is needed.  Testing Sites:    [facility] Location:                            [address], [place] (old [facility]) Hours:                                 8a-3:45p, M-F  [facility] Location:                           [address] [place], [place] [address]                                                              [place] ([place]) Hours:                                 8a-3:45p, M-F  [facility] Location:                            [place], [place] (across from [place]) Hours:                                 8a-3:45p, M-F  Positive test. These tests are not 100% perfect, but if you tested positive for COVID-19, this  confirms that you have contracted the SARS-CoV-2 virus. STAY HOME to complete full Quarantine per CDC guidelines.  Negative test. These tests are not 100% perfect but if you tested negative for COVID-19, this indicates that you may not have contracted the SARS-CoV-2 virus. Follow your  doctor's recommendations and the CDC guidelines.

## 2018-11-26 ENCOUNTER — Other Ambulatory Visit: Payer: Self-pay

## 2018-11-26 DIAGNOSIS — Z20822 Contact with and (suspected) exposure to covid-19: Secondary | ICD-10-CM

## 2018-11-29 LAB — NOVEL CORONAVIRUS, NAA: SARS-CoV-2, NAA: NOT DETECTED

## 2019-03-26 ENCOUNTER — Other Ambulatory Visit: Payer: Self-pay

## 2019-03-26 DIAGNOSIS — Z20822 Contact with and (suspected) exposure to covid-19: Secondary | ICD-10-CM

## 2019-03-29 LAB — NOVEL CORONAVIRUS, NAA: SARS-CoV-2, NAA: NOT DETECTED

## 2019-04-06 ENCOUNTER — Other Ambulatory Visit: Payer: Self-pay | Admitting: Family Medicine

## 2019-04-06 DIAGNOSIS — J301 Allergic rhinitis due to pollen: Secondary | ICD-10-CM

## 2019-04-06 NOTE — Telephone Encounter (Signed)
Please advise. Prescribed by Dr. Martinique. [facility] for refills?

## 2019-04-27 ENCOUNTER — Ambulatory Visit: Payer: 59 | Attending: Internal Medicine

## 2019-04-27 DIAGNOSIS — Z20822 Contact with and (suspected) exposure to covid-19: Secondary | ICD-10-CM

## 2019-04-28 LAB — NOVEL CORONAVIRUS, NAA: SARS-CoV-2, NAA: DETECTED — AB

## 2019-05-09 ENCOUNTER — Other Ambulatory Visit: Payer: 59

## 2019-08-22 LAB — HM MAMMOGRAPHY

## 2019-08-24 ENCOUNTER — Encounter: Payer: Self-pay | Admitting: Internal Medicine

## 2019-09-21 ENCOUNTER — Other Ambulatory Visit: Payer: Self-pay

## 2019-09-22 ENCOUNTER — Encounter: Payer: Self-pay | Admitting: Internal Medicine

## 2019-09-22 ENCOUNTER — Ambulatory Visit (INDEPENDENT_AMBULATORY_CARE_PROVIDER_SITE_OTHER): Payer: 59 | Admitting: Internal Medicine

## 2019-09-22 VITALS — BP 110/78 | HR 65 | Temp 97.3°F | Ht 66.0 in | Wt 191.1 lb

## 2019-09-22 DIAGNOSIS — R350 Frequency of micturition: Secondary | ICD-10-CM | POA: Diagnosis not present

## 2019-09-22 DIAGNOSIS — E785 Hyperlipidemia, unspecified: Secondary | ICD-10-CM

## 2019-09-22 LAB — POCT URINALYSIS DIPSTICK
Bilirubin, UA: NEGATIVE
Blood, UA: NEGATIVE
Glucose, UA: NEGATIVE
Ketones, UA: NEGATIVE
Leukocytes, UA: NEGATIVE
Nitrite, UA: NEGATIVE
Protein, UA: NEGATIVE
Spec Grav, UA: 1.02 (ref 1.010–1.025)
Urobilinogen, UA: 0.2 E.U./dL
pH, UA: 6 (ref 5.0–8.0)

## 2019-09-22 NOTE — Patient Instructions (Signed)
-  Nice seeing you today!!  -See you back in 6 months for your physical. Please come in fasting.

## 2019-09-22 NOTE — Progress Notes (Signed)
Established Patient Office Visit     This visit occurred during the SARS-CoV-2 public health emergency.  Safety protocols were in place, including screening questions prior to the visit, additional usage of staff PPE, and extensive cleaning of exam room while observing appropriate contact time as indicated for disinfecting solutions.    CC/Reason for Visit: Discuss some acute concerns  HPI: Suzanne Atkinson is a 39 y.o. female who is coming in today for the above mentioned reasons.  She is here today for acute issues:  1.  She visited her gynecologist and February and was told her cholesterol was high at follow-up with me.  Her total cholesterol was 254 with an LDL cholesterol of 176, her triglycerides were 57 and HDL was 65.  2.  She thinks she has symptoms of an overactive bladder.  She first started noticing this less than a month ago.  She feels like at times she has to go to the bathroom every 15 minutes.  She has not yet been incontinent.  She has had no prior children, she does not have any dysuria or any other UTI symptoms.  She has not had fever, nausea, vomiting or abdominal pain or flank pain.   Past Medical/Surgical History: Past Medical History:  Diagnosis Date  . Acid reflux   . Allergy   . Chickenpox   . Colon polyps   . Diarrhea   . DUB (dysfunctional uterine bleeding)   . Sciatica   . UTI (urinary tract infection)     Past Surgical History:  Procedure Laterality Date  . None      Social History:  reports that she has never smoked. She has never used smokeless tobacco. She reports current alcohol use of about 1.0 standard drinks of alcohol per week. She reports that she does not use drugs.  Allergies: Allergies  Allergen Reactions  . Ceclor [Cefaclor]     As a baby    Family History:  Family History  Problem Relation Age of Onset  . Cancer - Colon Father   . High blood pressure Mother   . High blood pressure Maternal Grandmother       Current Outpatient Medications:  .  Ascorbic Acid (VITAMIN C PO), Take by mouth., Disp: , Rfl:  .  cetirizine (ZYRTEC) 10 MG tablet, Take 10 mg by mouth daily., Disp: , Rfl:  .  Fluticasone Propionate (FLONASE NA), Place into the nose., Disp: , Rfl:  .  levonorgestrel (MIRENA) 20 MCG/24HR IUD, 1 each by Intrauterine route once., Disp: , Rfl:  .  MAGNESIUM PO, Take by mouth., Disp: , Rfl:  .  montelukast (SINGULAIR) 10 MG tablet, TAKE ONE TABLET BY MOUTH EVERY NIGHT AT BEDTIME, Disp: 30 tablet, Rfl: 2 .  Multiple Vitamins-Minerals (MULTIVITAMIN ADULTS PO), Take by mouth., Disp: , Rfl:  .  VITAMIN D PO, Take by mouth daily., Disp: , Rfl:   Review of Systems:  Constitutional: Denies fever, chills, diaphoresis, appetite change and fatigue.  HEENT: Denies photophobia, eye pain, redness, hearing loss, ear pain, congestion, sore throat, rhinorrhea, sneezing, mouth sores, trouble swallowing, neck pain, neck stiffness and tinnitus.   Respiratory: Denies SOB, DOE, cough, chest tightness,  and wheezing.   Cardiovascular: Denies chest pain, palpitations and leg swelling.  Gastrointestinal: Denies nausea, vomiting, abdominal pain, diarrhea, constipation, blood in stool and abdominal distention.  Genitourinary: Denies dysuria, hematuria, flank pain and difficulty urinating.  Endocrine: Denies: hot or cold intolerance, sweats, changes in hair or nails, polyuria,  polydipsia. Musculoskeletal: Denies myalgias, back pain, joint swelling, arthralgias and gait problem.  Skin: Denies pallor, rash and wound.  Neurological: Denies dizziness, seizures, syncope, weakness, light-headedness, numbness and headaches.  Hematological: Denies adenopathy. Easy bruising, personal or family bleeding history  Psychiatric/Behavioral: Denies suicidal ideation, mood changes, confusion, nervousness, sleep disturbance and agitation    Physical Exam: Vitals:   09/22/19 1116  BP: 110/78  Pulse: 65  Temp: (!) 97.3 F  (36.3 C)  TempSrc: Temporal  SpO2: 99%  Weight: 191 lb 1.6 oz (86.7 kg)  Height: 5\' 6"  (1.676 m)    Body mass index is 30.84 kg/m.   Constitutional: NAD, calm, comfortable Eyes: PERRL, lids and conjunctivae normal ENMT: Mucous membranes are moist.  Respiratory: clear to auscultation bilaterally, no wheezing, no crackles. Normal respiratory effort. No accessory muscle use.  Cardiovascular: Regular rate and rhythm, no murmurs / rubs / gallops. No extremity edema. 2+ pedal pulses. No carotid bruits.  Neurologic: Grossly intact and nonfocal Psychiatric: Normal judgment and insight. Alert and oriented x 3. Normal mood.    Impression and Plan:  Frequency of urination -Urine dipstick in office today is negative for infection. -She likely has urgency incontinence. -We have discussed keeping a voiding diary and doing bladder training.  She has been given an information handout on this.  Hyperlipidemia, unspecified hyperlipidemia type -She has elected to attempt lifestyle modifications first. -She will return in 6 months for follow-up lipid panel.    Patient Instructions  -Nice seeing you today!!  -See you back in 6 months for your physical. Please come in fasting.       [name], MD [facility]

## 2019-11-08 ENCOUNTER — Telehealth: Payer: Self-pay | Admitting: Internal Medicine

## 2019-11-08 DIAGNOSIS — N3281 Overactive bladder: Secondary | ICD-10-CM

## 2019-11-08 NOTE — Telephone Encounter (Signed)
Pt would like to know if there is some type of medication that she can get for an overactive bladder.   The exercise is not working.   Pharm:  [facility] on [facility]

## 2019-11-09 NOTE — Addendum Note (Signed)
Addended by: Westley Hummer B on: 11/09/2019 10:26 AM   Modules accepted: Orders

## 2019-11-09 NOTE — Telephone Encounter (Signed)
Recommend urology referral.

## 2019-11-09 NOTE — Telephone Encounter (Signed)
Patient is aware and a referral placed.

## 2019-11-09 NOTE — Telephone Encounter (Signed)
Left message on machine for patient to return our call 

## 2020-02-21 IMAGING — CT CT ENTEROGRAPHY (ABD-PELV W/ CM)
2 of 5 series · 16 of 46 positions shown, 18 images · IV contrast (iopamidol)
Comparison: None.

CLINICAL DATA: Chronic diarrhea. Evaluate small bowel. IBS
symptoms.

EXAM:
CT ABDOMEN AND PELVIS WITH CONTRAST (ENTEROGRAPHY)
TECHNIQUE: Multidetector CT of the abdomen and pelvis during bolus
administration of intravenous contrast. Negative oral contrast was
given.
CONTRAST:  100mL [id number] IOPAMIDOL ([id number]) INJECTION 61%

[Series 4: enterography 2.00 br40 s3 cor cor · coronal · 0.61mm/px · 3 of 131 slices shown]
[im 44/131  soft-tissue]
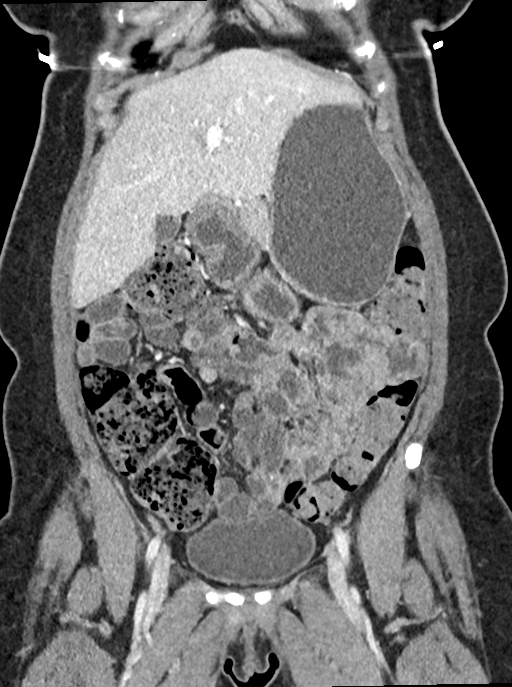
[im 58/131  soft-tissue]
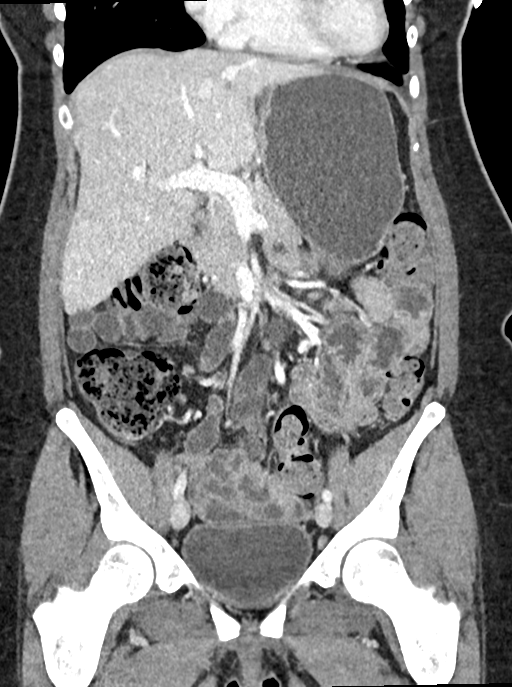
[im 73/131  soft-tissue]
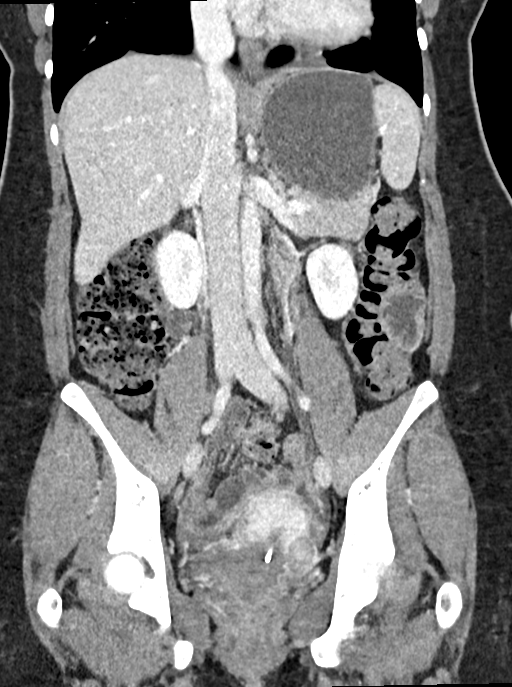

[Series 9: enterography 3.00 br40 s3 axial 3mm · axial · 0.59mm/px · z∈[+1019,+1388]mm · 13 of 139 slices shown, 15 images]
[im 8/139  soft-tissue]
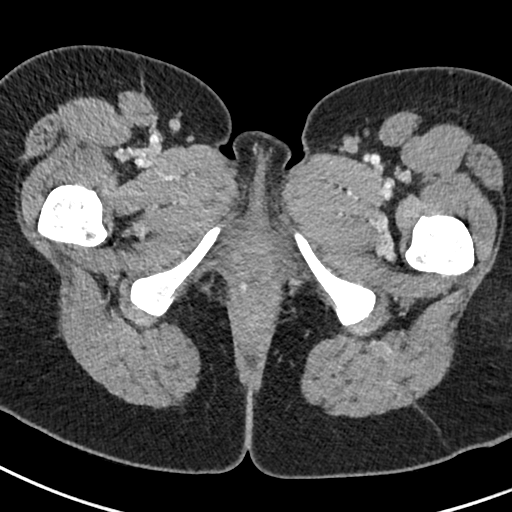
[im 8/139  bone]
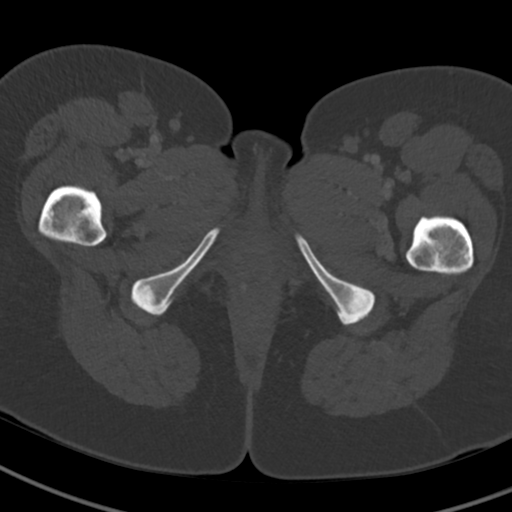
[im 16/139  soft-tissue]
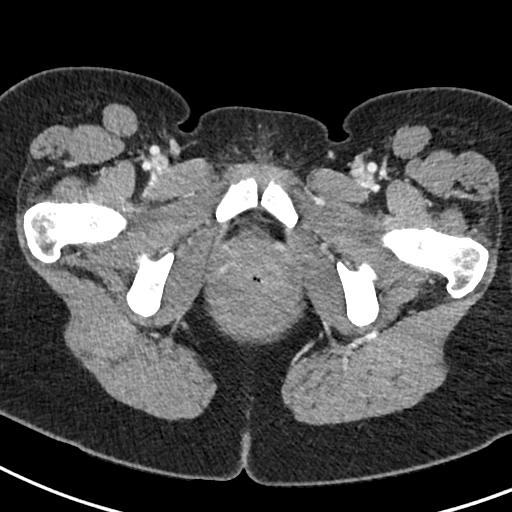
[im 31/139  soft-tissue]
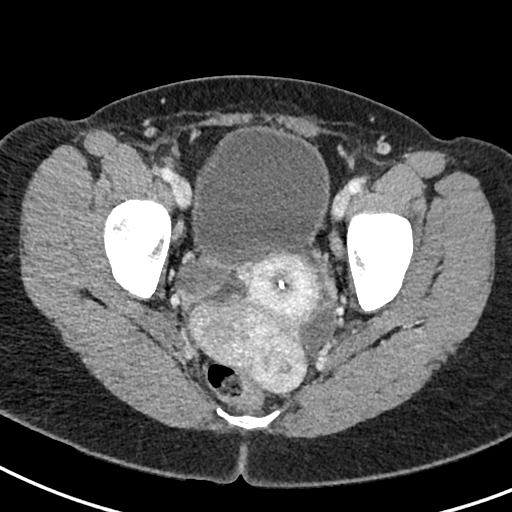
[im 39/139  soft-tissue]
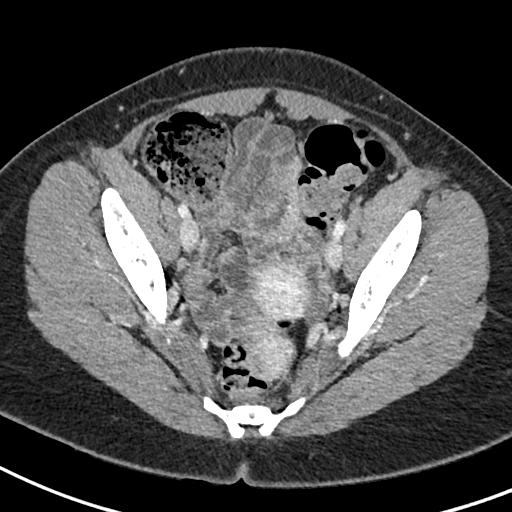
[im 47/139  soft-tissue]
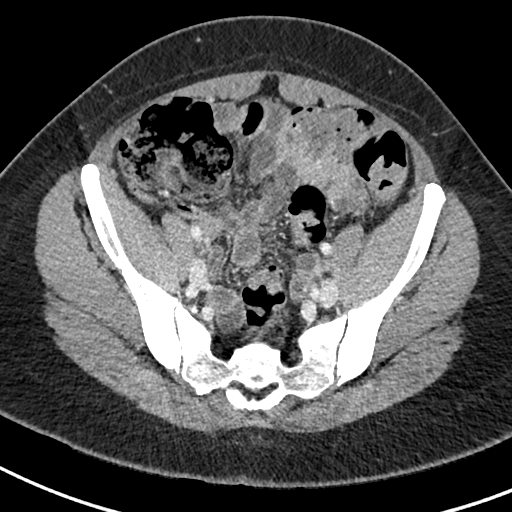
[im 62/139  soft-tissue]
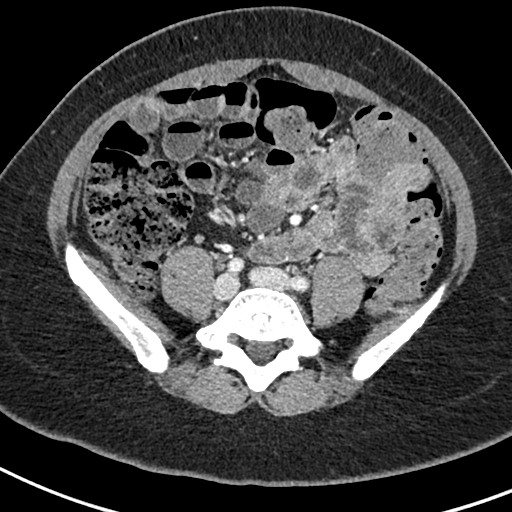
[im 70/139  soft-tissue]
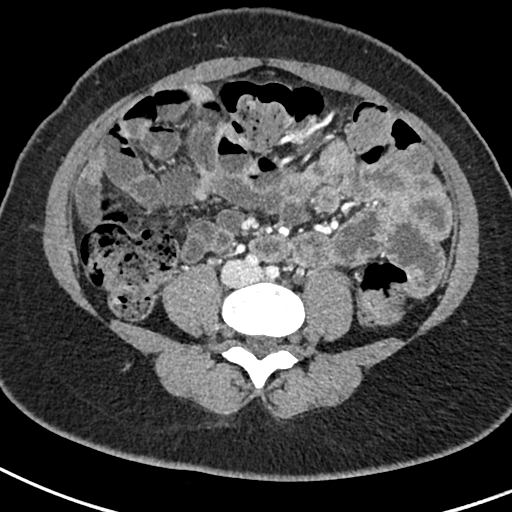
[im 77/139  soft-tissue]
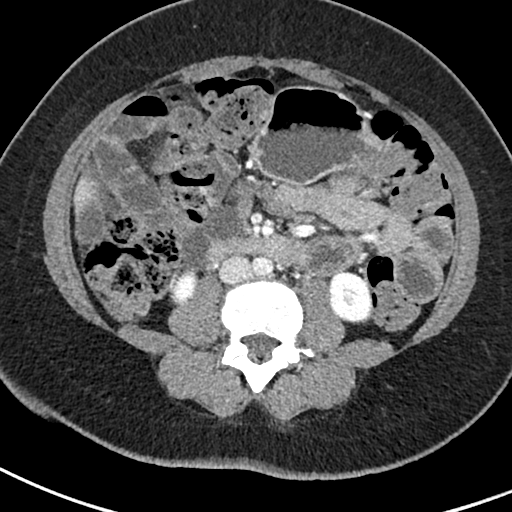
[im 93/139  soft-tissue]
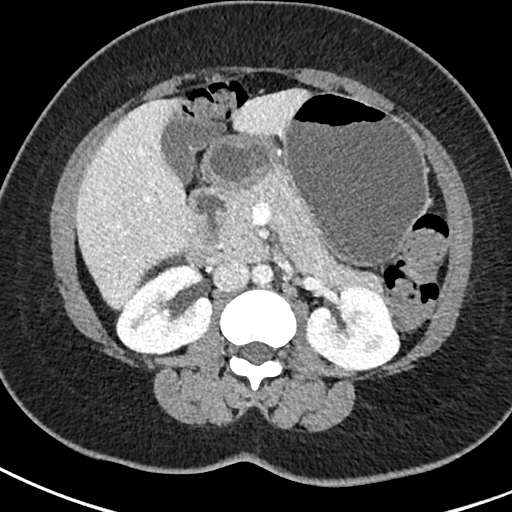
[im 93/139  bone]
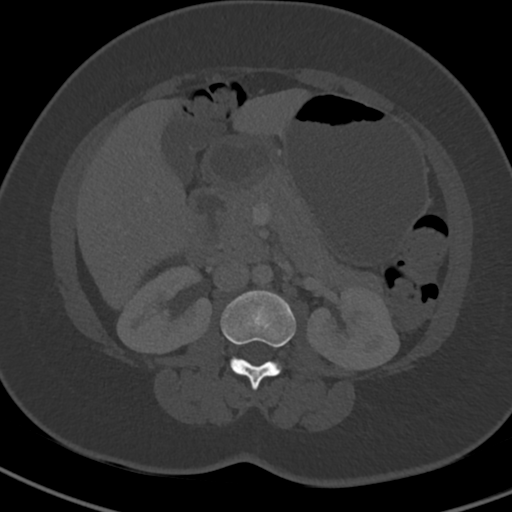
[im 100/139  soft-tissue]
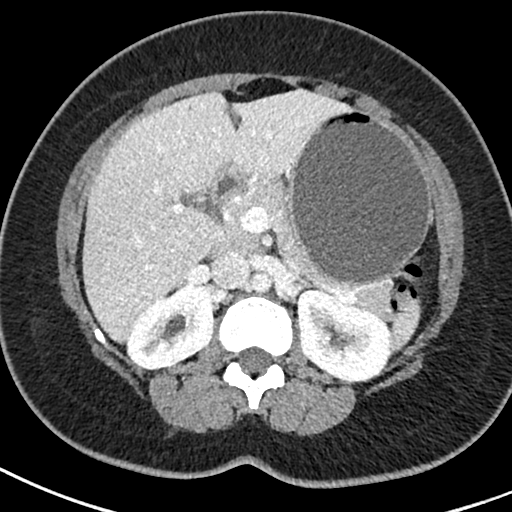
[im 108/139  soft-tissue]
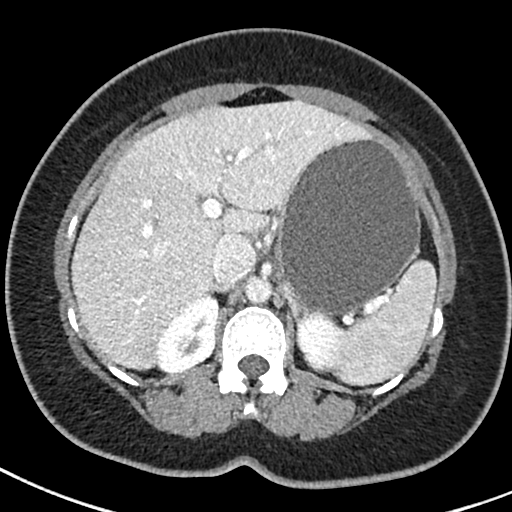
[im 123/139  soft-tissue]
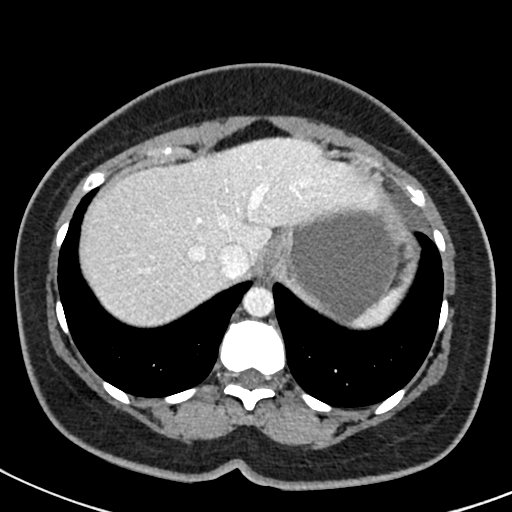
[im 131/139  soft-tissue]
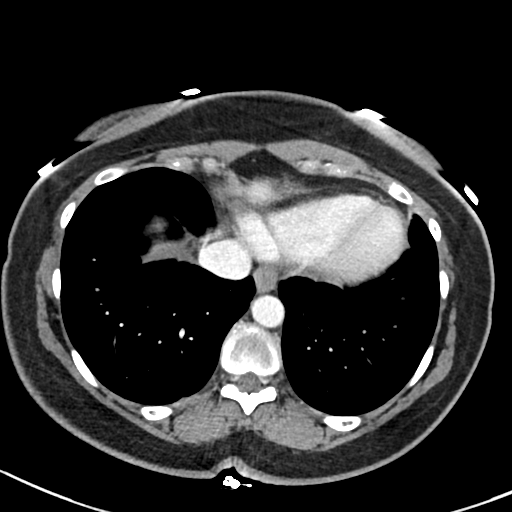

[16 of 46 positions shown; findings below may reference images not displayed]

FINDINGS: Lower chest:  No acute findings.

Hepatobiliary: No masses or other significant abnormality.

Pancreas: No mass, inflammatory changes, or other significant
abnormality.

Spleen: Within normal limits in size and appearance.

Adrenals/Urinary Tract: No masses identified. No evidence of
hydronephrosis.

Stomach/Bowel: Stomach appears normal. There is no abnormal
dilatation of the small bowel loops. No abscess small bowel wall
thickening or inflammation identified. The terminal ileum is
identified and appears within normal limits. The appendix is
visualized and appears normal. Moderate stool burden identified
throughout the colon. No pathologic dilatation of the colon. No
abnormal wall thickening or inflammation identified.

Vascular/Lymphatic: Normal appearance of the abdominal aorta. No
enlarged retroperitoneal or mesenteric adenopathy. No enlarged
pelvic or inguinal lymph nodes.

Reproductive: IUD is identified within the endometrial cavity.
Posterior fundal fibroid is noted measuring 4.6 cm, image 81/6. No
adnexal mass identified.

Other: No ascites or fluid collections identified.

Musculoskeletal: There is symmetric sclerosis on both sides of the
SI joint compatible with sacroiliitis.
IMPRESSION: 1. Normal appearance of the small and large bowel loops. No findings
to suggest enteritis.
2. Bilateral sacroiliitis.
3. Uterine fibroid.

## 2020-03-27 ENCOUNTER — Encounter: Payer: 59 | Admitting: Internal Medicine

## 2020-04-05 ENCOUNTER — Encounter: Payer: 59 | Admitting: Internal Medicine

## 2020-04-17 ENCOUNTER — Telehealth (INDEPENDENT_AMBULATORY_CARE_PROVIDER_SITE_OTHER): Payer: 59 | Admitting: Internal Medicine

## 2020-04-17 ENCOUNTER — Encounter: Payer: Self-pay | Admitting: Internal Medicine

## 2020-04-17 DIAGNOSIS — F321 Major depressive disorder, single episode, moderate: Secondary | ICD-10-CM

## 2020-04-17 MED ORDER — SERTRALINE HCL 50 MG PO TABS: 50.0000 mg | ORAL_TABLET | Freq: Every day | ORAL | 1 refills | Status: DC

## 2020-04-17 NOTE — Progress Notes (Signed)
Virtual Visit via Video Note  I connected with Suzanne Atkinson on 04/17/20 at  1:30 PM EST by a video enabled telemedicine application and verified that I am speaking with the correct person using two identifiers.  Location patient: home Location provider: work office Persons participating in the virtual visit: patient, provider  I discussed the limitations of evaluation and management by telemedicine and the availability of in person appointments. The patient expressed understanding and agreed to proceed.   HPI: She has scheduled this visit to discuss depression.  She states that over the past few weeks she has been increasingly depressed and anxious.  She will sometimes have crying spells at work, she feels nervous, anxious with decreased energy.  She has a lot of social stressors that mainly stem around issues with living with her mother-in-law which causes decreased intimacy in her marriage.  She has lately had an attraction to a woman which bothers her as she is "not a lesbian".  Yesterday morning she had thoughts of suicide and grabbed a handful of pills but then she put them back.  She called her sister who went to pick her up and took her to an urgent care.  She had her first appointment with her therapist yesterday.  She was very happy with that appointment is scheduled for follow-up next week.  She is currently living with her sister.  She does not currently have any self harmful thoughts.  She has spoken to her husband at length.  They are both interested in counseling.   ROS: Constitutional: Denies fever, chills, diaphoresis, appetite change and fatigue.  HEENT: Denies photophobia, eye pain, redness, hearing loss, ear pain, congestion, sore throat, rhinorrhea, sneezing, mouth sores, trouble swallowing, neck pain, neck stiffness and tinnitus.   Respiratory: Denies SOB, DOE, cough, chest tightness,  and wheezing.   Cardiovascular: Denies chest pain, palpitations and leg  swelling.  Gastrointestinal: Denies nausea, vomiting, abdominal pain, diarrhea, constipation, blood in stool and abdominal distention.  Genitourinary: Denies dysuria, urgency, frequency, hematuria, flank pain and difficulty urinating.  Endocrine: Denies: hot or cold intolerance, sweats, changes in hair or nails, polyuria, polydipsia. Musculoskeletal: Denies myalgias, back pain, joint swelling, arthralgias and gait problem.  Skin: Denies pallor, rash and wound.  Neurological: Denies dizziness, seizures, syncope, weakness, light-headedness, numbness and headaches.  Hematological: Denies adenopathy. Easy bruising, personal or family bleeding history  Psychiatric/Behavioral: Denies confusion.  Past Medical History:  Diagnosis Date  . Acid reflux   . Allergy   . Chickenpox   . Colon polyps   . Diarrhea   . DUB (dysfunctional uterine bleeding)   . Sciatica   . UTI (urinary tract infection)     Past Surgical History:  Procedure Laterality Date  . None      Family History  Problem Relation Age of Onset  . Cancer - Colon Father   . High blood pressure Mother   . High blood pressure Maternal Grandmother     SOCIAL HX:   reports that she has never smoked. She has never used smokeless tobacco. She reports current alcohol use of about 1.0 standard drink of alcohol per week. She reports that she does not use drugs.   Current Outpatient Medications:  .  cetirizine (ZYRTEC) 10 MG tablet, Take 10 mg by mouth daily., Disp: , Rfl:  .  MAGNESIUM PO, Take by mouth., Disp: , Rfl:  .  Multiple Vitamins-Minerals (MULTIVITAMIN ADULTS PO), Take by mouth., Disp: , Rfl:  .  sertraline (ZOLOFT)  50 MG tablet, Take 1 tablet (50 mg total) by mouth daily., Disp: 90 tablet, Rfl: 1  EXAM:   VITALS per patient if applicable: None reported  GENERAL: alert, oriented, appears well and in no acute distress, tearful at times  HEENT: atraumatic, conjunttiva clear, no obvious abnormalities on inspection of  external nose and ears  NECK: normal movements of the head and neck  LUNGS: on inspection no signs of respiratory distress, breathing rate appears normal, no obvious gross increased work of breathing, gasping or wheezing  CV: no obvious cyanosis  MS: moves all visible extremities without noticeable abnormality  PSYCH/NEURO: pleasant and cooperative, no obvious depression or anxiety, speech and thought processing grossly intact  ASSESSMENT AND PLAN:   Depression, major, single episode, moderate ([place]) -She has already established with a therapist as of yesterday. -She has already spoken to her husband, they will be pursuing counseling sessions as a couple. -I will go ahead and start her on Zoloft 50 mg daily. -She will schedule follow-up in 4 to 6 weeks.    I discussed the assessment and treatment plan with the patient. The patient was provided an opportunity to ask questions and all were answered. The patient agreed with the plan and demonstrated an understanding of the instructions.   The patient was advised to call back or seek an in-person evaluation if the symptoms worsen or if the condition fails to improve as anticipated.    [name], MD  [facility]

## 2020-04-24 ENCOUNTER — Encounter (HOSPITAL_COMMUNITY): Payer: Self-pay

## 2020-04-24 ENCOUNTER — Other Ambulatory Visit: Payer: Self-pay

## 2020-04-24 ENCOUNTER — Emergency Department (HOSPITAL_COMMUNITY)
Admission: EM | Admit: 2020-04-24 | Discharge: 2020-04-24 | Disposition: A | Discharge status: Home / Self Care | Payer: 59 | Attending: Emergency Medicine | Admitting: Emergency Medicine

## 2020-04-24 DIAGNOSIS — F32A Depression, unspecified: Secondary | ICD-10-CM

## 2020-04-24 DIAGNOSIS — Z5321 Procedure and treatment not carried out due to patient leaving prior to being seen by health care provider: Secondary | ICD-10-CM | POA: Diagnosis not present

## 2020-04-24 DIAGNOSIS — F419 Anxiety disorder, unspecified: Secondary | ICD-10-CM | POA: Insufficient documentation

## 2020-04-24 NOTE — ED Triage Notes (Signed)
Pt reports having an episode of depression last week and her PCP started her on Zoloft on Tuesday. Pt reports increased anxiety after returning to work today. Pt is tearful in triage at this time.

## 2020-04-24 NOTE — ED Notes (Signed)
Pt informed staff that she was leaving. 

## 2020-04-26 ENCOUNTER — Encounter: Payer: Self-pay | Admitting: Family Medicine

## 2020-04-26 ENCOUNTER — Ambulatory Visit: Payer: 59 | Admitting: Family Medicine

## 2020-04-26 ENCOUNTER — Other Ambulatory Visit: Payer: Self-pay

## 2020-04-26 VITALS — BP 118/78 | HR 68 | Temp 97.9°F | Ht 66.0 in | Wt 189.0 lb

## 2020-04-26 DIAGNOSIS — F418 Other specified anxiety disorders: Secondary | ICD-10-CM | POA: Diagnosis not present

## 2020-04-26 MED ORDER — LORAZEPAM 0.5 MG PO TABS: 0.5000 mg | ORAL_TABLET | Freq: Three times a day (TID) | ORAL | 0 refills | Status: DC | PRN

## 2020-04-26 NOTE — Progress Notes (Signed)
   Subjective:    Patient ID: Suzanne Atkinson, female    DOB: 1981/02/22, 39 y.o.   MRN: [id number]  HPI Here for depression and anxiety. She has been dealing with mild depression for years, but over the past month her anxiety levels have been escalating. She sometimes has a "breakdown" where all she can do is sit down and cry. She says the major source of stress in her life is her job. 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 She says she simply cannot go back to work for [employer] and she asks me to write her out of work. She saw Dr. Jerilee Hoh on 04-17-20, and she was started on Zoloft 50 mg daily. So far she does not feel any improvement. She sleeps well and her appetite is normal. She admits to some passive suicidal thoughts, but she denies any intent or planning. She has a good relationship with her husband. She has found a therapist, and they have already met twice. She has missed all of last week at work and most of this week. She is scheduled to follow up with Dr. Jerilee Hoh on 05-23-19.   Review of Systems  Constitutional: Negative.   Respiratory: Negative.   Cardiovascular: Negative.   Neurological: Negative.   Psychiatric/Behavioral: Positive for decreased concentration, dysphoric mood and suicidal ideas. Negative for agitation, behavioral problems, confusion, hallucinations, self-injury and sleep disturbance. The patient is nervous/anxious.        Objective:   Physical Exam Constitutional:      Appearance: Normal appearance.  Cardiovascular:     Rate and Rhythm: Normal rate and regular rhythm.     Pulses: Normal pulses.     Heart sounds: Normal heart sounds.  Pulmonary:     Effort: Pulmonary effort is normal.     Breath sounds: Normal breath sounds.  Neurological:     General: No focal deficit present.     Mental Status: She is alert and oriented to person, place, and time.  Psychiatric:        Behavior: Behavior  normal.        Thought Content: Thought content normal.        Judgment: Judgment normal.     Comments: Anxious and tearful. Good eye contact            Assessment & Plan:  Depression with anxiety. She will continue on Zoloft 50 mg daily and we will add Lorazepam to use as needed. We will write her out of work from 04-16-20 until 05-07-20. She will follow up with Dr. Jerilee Hoh. We spent 35 minutes today discussing these issues.  Suzanne Penna, MD

## 2020-05-22 ENCOUNTER — Ambulatory Visit: Payer: 59 | Admitting: Internal Medicine

## 2020-07-17 ENCOUNTER — Telehealth (INDEPENDENT_AMBULATORY_CARE_PROVIDER_SITE_OTHER): Payer: BC Managed Care – PPO | Admitting: Internal Medicine

## 2020-07-17 ENCOUNTER — Encounter: Payer: Self-pay | Admitting: Internal Medicine

## 2020-07-17 VITALS — Wt 200.0 lb

## 2020-07-17 DIAGNOSIS — F418 Other specified anxiety disorders: Secondary | ICD-10-CM

## 2020-07-17 NOTE — Progress Notes (Signed)
Virtual Visit via Video Note  I connected with Suzanne Atkinson on 07/17/20 at  3:45 PM EDT by a video enabled telemedicine application and verified that I am speaking with the correct person using two identifiers.  Location patient: home Location provider: work office Persons participating in the virtual visit: patient, provider  I discussed the limitations of evaluation and management by telemedicine and the availability of in person appointments. The patient expressed understanding and agreed to proceed.   HPI: She has scheduled this visit to discuss continued anxiety and depression issues.  I last spoke to her about this in December.  She was started on Zoloft 50 mg but she has not been taking it consistently, she feels like it messes her sleeping pattern.  She feels like her symptoms have worsened in the past 2 weeks.  She had been seeing a counselor but stopped due to insurance issues.  She had thoughts of suicide over the weekend but no concrete plan of ending her life.  She feels much better today.  She started taking her Zoloft again as of Sunday.   ROS: Constitutional: Denies fever, chills, diaphoresis, appetite change and fatigue.  HEENT: Denies photophobia, eye pain, redness, hearing loss, ear pain, congestion, sore throat, rhinorrhea, sneezing, mouth sores, trouble swallowing, neck pain, neck stiffness and tinnitus.   Respiratory: Denies SOB, DOE, cough, chest tightness,  and wheezing.   Cardiovascular: Denies chest pain, palpitations and leg swelling.  Gastrointestinal: Denies nausea, vomiting, abdominal pain, diarrhea, constipation, blood in stool and abdominal distention.  Genitourinary: Denies dysuria, urgency, frequency, hematuria, flank pain and difficulty urinating.  Endocrine: Denies: hot or cold intolerance, sweats, changes in hair or nails, polyuria, polydipsia. Musculoskeletal: Denies myalgias, back pain, joint swelling, arthralgias and gait problem.  Skin:  Denies pallor, rash and wound.  Neurological: Denies dizziness, seizures, syncope, weakness, light-headedness, numbness and headaches.  Hematological: Denies adenopathy. Easy bruising, personal or family bleeding history  Psychiatric/Behavioral: Positive for suicidal ideation, mood changes, confusion, nervousness, sleep disturbance   Past Medical History:  Diagnosis Date  . Acid reflux   . Allergy   . Chickenpox   . Colon polyps   . Diarrhea   . DUB (dysfunctional uterine bleeding)   . Sciatica   . UTI (urinary tract infection)     Past Surgical History:  Procedure Laterality Date  . None      Family History  Problem Relation Age of Onset  . Cancer - Colon Father   . High blood pressure Mother   . High blood pressure Maternal Grandmother     SOCIAL HX:   reports that she has never smoked. She has never used smokeless tobacco. She reports current alcohol use of about 1.0 standard drink of alcohol per week. She reports that she does not use drugs.   Current Outpatient Medications:  .  sertraline (ZOLOFT) 50 MG tablet, Take 1 tablet (50 mg total) by mouth daily., Disp: 90 tablet, Rfl: 1 .  Multiple Vitamins-Minerals (MULTIVITAMIN ADULTS PO), Take by mouth. (Patient not taking: Reported on 07/17/2020), Disp: , Rfl:   EXAM:   VITALS per patient if applicable: None reported  GENERAL: alert, oriented, appears well and in no acute distress  HEENT: atraumatic, conjunttiva clear, no obvious abnormalities on inspection of external nose and ears, wears corrective lenses  NECK: normal movements of the head and neck  LUNGS: on inspection no signs of respiratory distress, breathing rate appears normal, no obvious gross increased work of breathing, gasping or  wheezing  CV: no obvious cyanosis  MS: moves all visible extremities without noticeable abnormality  PSYCH/NEURO: pleasant and cooperative, no obvious depression or anxiety, speech and thought processing grossly  intact  ASSESSMENT AND PLAN:   Depression with anxiety  -She states she is better this week as opposed to last week, she thinks it is because she has started taking her Zoloft again.  She will schedule appointment ASAP with counselor.  I believe it is time to refer her to psychiatry as she has had recurring issues with self-harm.  She adamantly denies suicidal intent at the moment.  I have provided her with resources to call if she does.  She knows she can also show up at the emergency department or at [facility] for evaluation.  I have stressed importance of medication compliance, CBT sessions.  She agrees with psychiatry referral.   I discussed the assessment and treatment plan with the patient. The patient was provided an opportunity to ask questions and all were answered. The patient agreed with the plan and demonstrated an understanding of the instructions.   The patient was advised to call back or seek an in-person evaluation if the symptoms worsen or if the condition fails to improve as anticipated.    [name], MD  [facility]

## 2020-10-22 ENCOUNTER — Telehealth: Payer: Self-pay | Admitting: Internal Medicine

## 2020-10-22 NOTE — Telephone Encounter (Signed)
Pt is calling in needing a refill on sertraline (ZOLOFT) 50 MG  Pharm:  [facility] [address]  Pt does not know if she is going to be needing a appointment before she can get the refill.

## 2020-10-23 ENCOUNTER — Other Ambulatory Visit: Payer: Self-pay | Admitting: Internal Medicine

## 2020-10-23 DIAGNOSIS — F321 Major depressive disorder, single episode, moderate: Secondary | ICD-10-CM

## 2020-10-23 MED ORDER — SERTRALINE HCL 50 MG PO TABS: 50.0000 mg | ORAL_TABLET | Freq: Every day | ORAL | 1 refills | Status: DC

## 2020-12-01 DIAGNOSIS — Z13220 Encounter for screening for lipoid disorders: Secondary | ICD-10-CM | POA: Diagnosis not present

## 2020-12-01 DIAGNOSIS — Z136 Encounter for screening for cardiovascular disorders: Secondary | ICD-10-CM | POA: Diagnosis not present

## 2020-12-01 DIAGNOSIS — Z131 Encounter for screening for diabetes mellitus: Secondary | ICD-10-CM | POA: Diagnosis not present

## 2021-01-03 DIAGNOSIS — L292 Pruritus vulvae: Secondary | ICD-10-CM | POA: Diagnosis not present

## 2021-01-03 DIAGNOSIS — N898 Other specified noninflammatory disorders of vagina: Secondary | ICD-10-CM | POA: Diagnosis not present

## 2021-01-24 DIAGNOSIS — M5412 Radiculopathy, cervical region: Secondary | ICD-10-CM | POA: Diagnosis not present

## 2021-01-24 DIAGNOSIS — M533 Sacrococcygeal disorders, not elsewhere classified: Secondary | ICD-10-CM | POA: Diagnosis not present

## 2021-01-24 DIAGNOSIS — M545 Low back pain, unspecified: Secondary | ICD-10-CM | POA: Diagnosis not present

## 2021-01-24 DIAGNOSIS — M542 Cervicalgia: Secondary | ICD-10-CM | POA: Diagnosis not present

## 2021-02-07 DIAGNOSIS — M5451 Vertebrogenic low back pain: Secondary | ICD-10-CM | POA: Diagnosis not present

## 2021-02-07 DIAGNOSIS — M542 Cervicalgia: Secondary | ICD-10-CM | POA: Diagnosis not present

## 2021-02-21 DIAGNOSIS — M5451 Vertebrogenic low back pain: Secondary | ICD-10-CM | POA: Diagnosis not present

## 2021-02-21 DIAGNOSIS — M542 Cervicalgia: Secondary | ICD-10-CM | POA: Diagnosis not present

## 2021-04-15 DIAGNOSIS — J069 Acute upper respiratory infection, unspecified: Secondary | ICD-10-CM | POA: Diagnosis not present

## 2021-04-15 DIAGNOSIS — R112 Nausea with vomiting, unspecified: Secondary | ICD-10-CM | POA: Diagnosis not present

## 2021-04-15 DIAGNOSIS — U071 COVID-19: Secondary | ICD-10-CM | POA: Diagnosis not present

## 2021-05-05 ENCOUNTER — Other Ambulatory Visit: Payer: Self-pay | Admitting: Internal Medicine

## 2021-05-05 DIAGNOSIS — F321 Major depressive disorder, single episode, moderate: Secondary | ICD-10-CM

## 2021-07-28 ENCOUNTER — Other Ambulatory Visit: Payer: Self-pay | Admitting: Internal Medicine

## 2021-07-28 DIAGNOSIS — F321 Major depressive disorder, single episode, moderate: Secondary | ICD-10-CM

## 2021-11-02 ENCOUNTER — Other Ambulatory Visit: Payer: Self-pay | Admitting: Internal Medicine

## 2021-11-02 DIAGNOSIS — F321 Major depressive disorder, single episode, moderate: Secondary | ICD-10-CM

## 2021-11-04 NOTE — Telephone Encounter (Signed)
Appointment 07/17/21 -She states she is better this week as opposed to last week, she thinks it is because she has started taking her Zoloft again.  She will schedule appointment ASAP with counselor.  I believe it is time to refer her to psychiatry as she has had recurring issues with self-harm.  She adamantly denies suicidal intent at the moment.  I have provided her with resources to call if she does.  She knows she can also show up at the emergency department or at [facility] for evaluation.  I have stressed importance of medication compliance, CBT sessions.  She agrees with psychiatry referral.

## 2022-01-17 ENCOUNTER — Telehealth (INDEPENDENT_AMBULATORY_CARE_PROVIDER_SITE_OTHER): Payer: BC Managed Care – PPO | Admitting: Adult Health

## 2022-01-17 ENCOUNTER — Encounter: Payer: Self-pay | Admitting: Adult Health

## 2022-01-17 VITALS — Ht 64.0 in | Wt 214.0 lb

## 2022-01-17 DIAGNOSIS — J069 Acute upper respiratory infection, unspecified: Secondary | ICD-10-CM | POA: Diagnosis not present

## 2022-01-17 MED ORDER — HYDROCODONE BIT-HOMATROP MBR 5-1.5 MG/5ML PO SOLN: 5.0000 mL | Freq: Three times a day (TID) | ORAL | 0 refills | Status: DC | PRN

## 2022-01-17 MED ORDER — PREDNISONE 10 MG PO TABS: ORAL_TABLET | ORAL | 0 refills | Status: DC

## 2022-01-17 NOTE — Progress Notes (Signed)
Virtual Visit via Video Note  I connected with Suzanne Atkinson  on 01/17/22 at  4:15 PM EDT by a video enabled telemedicine application and verified that I am speaking with the correct person using two identifiers.  Location patient: home Location provider:work or home office Persons participating in the virtual visit: patient, provider  I discussed the limitations of evaluation and management by telemedicine and the availability of in person appointments. The patient expressed understanding and agreed to proceed.   HPI: 41 year old female who is being evaluated today for an acute issue.  Symptoms started 2 days ago.  Symptoms started with a sore throat that has started to resolve and over the last 24 hours she has developed a semiproductive cough that seems to be getting worse and some sinus pain and pressure with clear drainage.  At home she has been using Tylenol and Claritin without improvement.  She denies fevers, chills, wheezing, or shortness of breath.  She did take an at home COVID test which was negative.   ROS: See pertinent positives and negatives per HPI.  Past Medical History:  Diagnosis Date   Acid reflux    Allergy    Chickenpox    Colon polyps    Diarrhea    DUB (dysfunctional uterine bleeding)    Sciatica    UTI (urinary tract infection)     Past Surgical History:  Procedure Laterality Date   None      Family History  Problem Relation Age of Onset   Cancer - Colon Father    High blood pressure Mother    High blood pressure Maternal Grandmother        Current Outpatient Medications:    HYDROcodone bit-homatropine (HYCODAN) 5-1.5 MG/5ML syrup, Take 5 mLs by mouth every 8 (eight) hours as needed for cough., Disp: 120 mL, Rfl: 0   Multiple Vitamins-Minerals (MULTIVITAMIN ADULTS PO), Take by mouth., Disp: , Rfl:    predniSONE (DELTASONE) 10 MG tablet, 40 mg x 3 days, 20 mg x 3 days, 10 mg x 3 days, Disp: 21 tablet, Rfl: 0   sertraline (ZOLOFT) 50 MG tablet,  TAKE 1 TABLET BY MOUTH EVERY DAY, Disp: 90 tablet, Rfl: 0  EXAM:  VITALS per patient if applicable:  GENERAL: alert, oriented, appears well and in no acute distress  HEENT: atraumatic, conjunttiva clear, no obvious abnormalities on inspection of external nose and ears  NECK: normal movements of the head and neck  LUNGS: on inspection no signs of respiratory distress, breathing rate appears normal, no obvious gross SOB, gasping or wheezing  CV: no obvious cyanosis  MS: moves all visible extremities without noticeable abnormality  PSYCH/NEURO: pleasant and cooperative, no obvious depression or anxiety, speech and thought processing grossly intact  ASSESSMENT AND PLAN:  Discussed the following assessment and plan:  Upper respiratory tract infection, unspecified type - Plan: predniSONE (DELTASONE) 10 MG tablet, HYDROcodone bit-homatropine (HYCODAN) 5-1.5 MG/5ML syrup     I discussed the assessment and treatment plan with the patient. The patient was provided an opportunity to ask questions and all were answered. The patient agreed with the plan and demonstrated an understanding of the instructions.   The patient was advised to call back or seek an in-person evaluation if the symptoms worsen or if the condition fails to improve as anticipated.   Suzanne Peng, NP

## 2022-01-20 ENCOUNTER — Telehealth: Payer: Self-pay | Admitting: Internal Medicine

## 2022-01-20 NOTE — Telephone Encounter (Signed)
Pt notified that appt will be needed since virtual visit on 01/17/22 pt believes symptoms are not getting better. Appt scheduled for 01/21/22 at 11a

## 2022-01-20 NOTE — Telephone Encounter (Signed)
Pt had a VV with NP on Friday and is calling to say her cough is out of control, she cannot sleep and can hear rattling in her chest.   Pt is wondering what else she can take or if she should be prescribed an inhaler.  Pt stated she accidentally took too many of the prednisone on Saturday. She took 4 pills three times.   Please advise [id number] [place] [address] [place]  [phone] Fax:  [phone]

## 2022-01-20 NOTE — Telephone Encounter (Signed)
Pt called again to say she is now blowing out greenish yellow mucus and thinks she may need an antibiotic.  Please advise.

## 2022-01-21 ENCOUNTER — Ambulatory Visit (INDEPENDENT_AMBULATORY_CARE_PROVIDER_SITE_OTHER): Payer: BC Managed Care – PPO

## 2022-01-21 ENCOUNTER — Encounter: Payer: Self-pay | Admitting: Internal Medicine

## 2022-01-21 ENCOUNTER — Ambulatory Visit (INDEPENDENT_AMBULATORY_CARE_PROVIDER_SITE_OTHER): Payer: BC Managed Care – PPO | Admitting: Internal Medicine

## 2022-01-21 VITALS — BP 112/78 | HR 60 | Temp 98.4°F | Ht 64.0 in | Wt 217.1 lb

## 2022-01-21 DIAGNOSIS — R059 Cough, unspecified: Secondary | ICD-10-CM

## 2022-01-21 DIAGNOSIS — R0989 Other specified symptoms and signs involving the circulatory and respiratory systems: Secondary | ICD-10-CM | POA: Diagnosis not present

## 2022-01-21 LAB — POCT INFLUENZA A/B
Influenza A, POC: NEGATIVE
Influenza B, POC: NEGATIVE

## 2022-01-21 LAB — POC COVID19 BINAXNOW: SARS Coronavirus 2 Ag: NEGATIVE

## 2022-01-21 NOTE — Progress Notes (Signed)
Established Patient Office Visit     CC/Reason for Visit: Persistent cough  HPI: Suzanne Atkinson is a 41 y.o. female who is coming in today for the above mentioned reasons.  For 5 days she has been experiencing cough, that is nonproductive of yellow sputum, headache, congestion, postnasal drip.  Her mother-in-law who lives with her has also been sick.  She had a virtual visit on Friday and was prescribed prednisone and Hycodan cough syrup without much relief.  She does not have fever or significant dyspnea on exertion.   Past Medical/Surgical History: Past Medical History:  Diagnosis Date   Acid reflux    Allergy    Chickenpox    Colon polyps    Diarrhea    DUB (dysfunctional uterine bleeding)    Sciatica    UTI (urinary tract infection)     Past Surgical History:  Procedure Laterality Date   None      Social History:  reports that she has never smoked. She has never used smokeless tobacco. She reports current alcohol use of about 1.0 standard drink of alcohol per week. She reports that she does not use drugs.  Allergies: Allergies  Allergen Reactions   Ceclor [Cefaclor]     As a baby    Family History:  Family History  Problem Relation Age of Onset   Cancer - Colon Father    High blood pressure Mother    High blood pressure Maternal Grandmother      Current Outpatient Medications:    HYDROcodone bit-homatropine (HYCODAN) 5-1.5 MG/5ML syrup, Take 5 mLs by mouth every 8 (eight) hours as needed for cough., Disp: 120 mL, Rfl: 0   Multiple Vitamins-Minerals (MULTIVITAMIN ADULTS PO), Take by mouth., Disp: , Rfl:    predniSONE (DELTASONE) 10 MG tablet, 40 mg x 3 days, 20 mg x 3 days, 10 mg x 3 days, Disp: 21 tablet, Rfl: 0   sertraline (ZOLOFT) 50 MG tablet, TAKE 1 TABLET BY MOUTH EVERY DAY, Disp: 90 tablet, Rfl: 0  Review of Systems:  Constitutional: Denies fever, chills. HEENT: Denies photophobia, eye pain, redness,  mouth sores, trouble swallowing,  neck pain, neck stiffness and tinnitus.   Respiratory: Denies SOB, DOE, chest tightness,  and wheezing.   Cardiovascular: Denies chest pain, palpitations and leg swelling.  Gastrointestinal: Denies nausea, vomiting, abdominal pain, diarrhea, constipation, blood in stool and abdominal distention.  Genitourinary: Denies dysuria, urgency, frequency, hematuria, flank pain and difficulty urinating.  Endocrine: Denies: hot or cold intolerance, sweats, changes in hair or nails, polyuria, polydipsia. Musculoskeletal: Denies myalgias, back pain, joint swelling, arthralgias and gait problem.  Skin: Denies pallor, rash and wound.  Neurological: Denies dizziness, seizures, syncope, weakness, light-headedness, numbness and headaches.  Hematological: Denies adenopathy. Easy bruising, personal or family bleeding history  Psychiatric/Behavioral: Denies suicidal ideation, mood changes, confusion, nervousness, sleep disturbance and agitation    Physical Exam: Vitals:   01/21/22 1127  BP: 112/78  Pulse: 60  Temp: 98.4 F (36.9 C)  SpO2: 96%  Weight: 217 lb 1.6 oz (98.5 kg)  Height: '5\' 4"'$  (1.626 m)    Body mass index is 37.27 kg/m.   Constitutional: NAD, calm, comfortable, constant coughing throughout exam Eyes: PERRL, lids and conjunctivae normal ENMT: Mucous membranes are moist.  Respiratory: Right basilar crackles. Normal respiratory effort. No accessory muscle use.  Cardiovascular: Regular rate and rhythm, no murmurs / rubs / gallops. No extremity edema. Psychiatric: Normal judgment and insight. Alert and oriented x 3. Normal mood.  Impression and Plan:  Cough, unspecified type - Plan: POC COVID-19 BinaxNow, POCT Influenza A/B, DG Chest 2 View  -In office flu and COVID tests are negative. -She has a persistent cough productive of yellow sputum and a right basilar crackles.  Sent for chest x-ray to rule out pneumonia.  If positive, will send antibiotics.  She was prescribed Hycodan cough  syrup which she is encouraged to use.  Time spent:30 minutes reviewing chart, interviewing and examining patient and formulating plan of care.      [name], MD [facility]

## 2022-01-22 ENCOUNTER — Telehealth: Payer: Self-pay | Admitting: Internal Medicine

## 2022-01-22 NOTE — Telephone Encounter (Signed)
Pt not feeling well, did labs yesterday, wants to know what she can do to treat her symptoms

## 2022-01-23 ENCOUNTER — Encounter: Payer: Self-pay | Admitting: Internal Medicine

## 2022-01-23 NOTE — Telephone Encounter (Signed)
See result note.  

## 2022-02-03 ENCOUNTER — Other Ambulatory Visit: Payer: Self-pay | Admitting: Internal Medicine

## 2022-02-03 DIAGNOSIS — F321 Major depressive disorder, single episode, moderate: Secondary | ICD-10-CM

## 2022-03-20 ENCOUNTER — Encounter: Payer: Self-pay | Admitting: Family Medicine

## 2022-03-20 ENCOUNTER — Ambulatory Visit (INDEPENDENT_AMBULATORY_CARE_PROVIDER_SITE_OTHER): Payer: BC Managed Care – PPO | Admitting: Family Medicine

## 2022-03-20 VITALS — BP 110/78 | HR 68 | Temp 98.4°F | Wt 223.0 lb

## 2022-03-20 DIAGNOSIS — F320 Major depressive disorder, single episode, mild: Secondary | ICD-10-CM | POA: Diagnosis not present

## 2022-03-20 DIAGNOSIS — F419 Anxiety disorder, unspecified: Secondary | ICD-10-CM | POA: Diagnosis not present

## 2022-03-20 MED ORDER — SERTRALINE HCL 50 MG PO TABS: 75.0000 mg | ORAL_TABLET | Freq: Every day | ORAL | 0 refills | Status: DC

## 2022-03-20 NOTE — Progress Notes (Signed)
Subjective:    Patient ID: Suzanne Atkinson, female    DOB: 1980-07-20, 41 y.o.   MRN: [id number]  Chief Complaint  Patient presents with   Anxiety    Patient notes increased symptoms despite Zoloft.    HPI Patient is a 41 year old female with pmh sig for anxiety, depression, HLD followed by Dr. Jerilee Hoh and seen today for ongoing concern.  Patient endorses increased anxiety in the last month to month and a half.  Patient notes family stress contributing to symptoms.  Also noticing decrease in focus.  Patient is a Research scientist (physical sciences) at a [employer], states she was pulled aside last night in regards to a decrease in work performance.  Pt made this appt after having a crying spell.  Patient's been on Zoloft 50 mg daily x2 years.  Inquires about increasing dose. Past Medical History:  Diagnosis Date   Acid reflux    Allergy    Chickenpox    Colon polyps    Diarrhea    DUB (dysfunctional uterine bleeding)    Sciatica    UTI (urinary tract infection)     Allergies  Allergen Reactions   Ceclor [Cefaclor]     As a baby    ROS General: Denies fever, chills, night sweats, changes in weight, changes in appetite HEENT: Denies headaches, ear pain, changes in vision, rhinorrhea, sore throat CV: Denies CP, palpitations, SOB, orthopnea Pulm: Denies SOB, cough, wheezing GI: Denies abdominal pain, nausea, vomiting, diarrhea, constipation GU: Denies dysuria, hematuria, frequency, vaginal discharge Msk: Denies muscle cramps, joint pains Neuro: Denies weakness, numbness, tingling Skin: Denies rashes, bruising Psych: Denies depression, anxiety, hallucinations  +anxiety, depression     Objective:    Blood pressure 110/78, pulse 68, temperature 98.4 F (36.9 C), temperature source Oral, weight 223 lb (101.2 kg), SpO2 97 %.   Gen. Pleasant, well-nourished, in no distress, mildly depressed affect, tearful HEENT: [place]/AT, face symmetric, conjunctiva clear, no scleral icterus, PERRLA, EOMI,  nares patent without drainage Lungs: no accessory muscle use Cardiovascular: RRR, no peripheral edema Neuro:  A&Ox3, CN II-XII intact, normal gait Skin:  Warm, no lesions/ rash   Wt Readings from Last 3 Encounters:  03/20/22 223 lb (101.2 kg)  01/21/22 217 lb 1.6 oz (98.5 kg)  01/17/22 214 lb (97.1 kg)    Lab Results  Component Value Date   WBC 4.7 06/10/2018   HGB 15.4 (H) 06/10/2018   HCT 45.2 06/10/2018   PLT 252.0 06/10/2018   GLUCOSE 67 (L) 06/10/2018   CHOL 254 (H) 06/10/2018   TRIG 57.0 06/10/2018   HDL 65.80 06/10/2018   LDLCALC 176 (H) 06/10/2018   ALT 20 06/10/2018   AST 17 06/10/2018   NA 137 06/10/2018   K 4.3 06/10/2018   CL 102 06/10/2018   CREATININE 0.62 06/10/2018   BUN 14 06/10/2018   CO2 28 06/10/2018   TSH 1.05 06/10/2018   HGBA1C 5.4 06/10/2018      03/20/2022    1:55 PM 01/21/2022   11:28 AM 07/17/2020    3:48 PM  Depression screen PHQ 2/9  Decreased Interest 1 0 1  Down, Depressed, Hopeless 1 0 1  PHQ - 2 Score 2 0 2  Altered sleeping 1  2  Tired, decreased energy 1  2  Change in appetite 2  1  Feeling bad or failure about yourself  1  1  Trouble concentrating 1  1  Moving slowly or fidgety/restless 0  1  Suicidal thoughts 0  1  PHQ-9 Score 8  11  Difficult doing work/chores Very difficult        03/20/2022    1:55 PM 04/17/2020    1:17 PM  GAD 7 : Generalized Anxiety Score  Nervous, Anxious, on Edge 1 1  Control/stop worrying 1 1  Worry too much - different things 2 1  Trouble relaxing 1 1  Restless 0 1  Easily annoyed or irritable 0 1  Afraid - awful might happen 0 1  Total GAD 7 Score 5 7  Anxiety Difficulty Somewhat difficult Somewhat difficult    Assessment/Plan:  Depression, major, single episode, mild (HCC)  - Plan: TSH, T4, Free, sertraline (ZOLOFT) 50 MG tablet  Anxiety - Plan: TSH, T4, Free, sertraline (ZOLOFT) 50 MG tablet  Patient was subjectively increased anxiety symptoms GAD-7 and PHQ-9 scores and 8  respectively.  Previously slightly higher.  Discussed self-care.  Discussed scheduling an appointment with [facility] for counseling.  We will discontinue Zoloft 50 mg daily.  Increase Zoloft to 75 mg daily.  Given 30-day supply as patient was advised to schedule follow-up with PCP prior to additional refills.  We will check thyroid function.  Patient to follow-up with PCP in 4-6 weeks  F/u in 4-6 wks for f/u and CPE with PCP  Grier Mitts, MD

## 2022-03-20 NOTE — Patient Instructions (Signed)
Your prescription was sent to the pharmacy for increased dose of Zoloft.  They do not make 75 mg tabs so you will need to take a tab and a half of the 50 mg tablets to achieve the total dose of 75 mg.  Do not forget to schedule your physical with your PCP and stop by the lab before leaving.

## 2022-03-21 LAB — T4, FREE: Free T4: 0.65 ng/dL (ref 0.60–1.60)

## 2022-03-21 LAB — TSH: TSH: 1.09 u[IU]/mL (ref 0.35–5.50)

## 2022-04-08 ENCOUNTER — Telehealth (INDEPENDENT_AMBULATORY_CARE_PROVIDER_SITE_OTHER): Payer: BC Managed Care – PPO | Admitting: Family Medicine

## 2022-04-08 ENCOUNTER — Encounter: Payer: Self-pay | Admitting: Family Medicine

## 2022-04-08 VITALS — Ht 66.0 in | Wt 223.0 lb

## 2022-04-08 DIAGNOSIS — U071 COVID-19: Secondary | ICD-10-CM

## 2022-04-08 MED ORDER — NIRMATRELVIR/RITONAVIR (PAXLOVID)TABLET: 3.0000 | ORAL_TABLET | Freq: Two times a day (BID) | ORAL | 0 refills | Status: AC

## 2022-04-08 MED ORDER — PROMETHAZINE-DM 6.25-15 MG/5ML PO SYRP: 5.0000 mL | ORAL_SOLUTION | Freq: Three times a day (TID) | ORAL | 0 refills | Status: DC | PRN

## 2022-04-08 NOTE — Patient Instructions (Signed)
---------------------------------------------------------------------------------------------------------------------------    WORK SLIP:  Patient Suzanne Atkinson,  1981/02/09, was seen for a medical visit today, 04/08/22 . Please excuse from work for a COVID illness. Patient will likely be contagious for an average of 7-10 days from the onset of symptoms, PLUS 1 day of no fever and improved symptoms. Other viruses tend to be contagious for 5-10 days on average. Will defer to employer for a sooner return to work if patient has 2 negative covid tests 48 hours apart and is feeling better, OR if symptoms have resolved or improved, it is greater than 5 days since the onset of symptoms and the patient can wear a high-quality, tight fitting mask such as N95 or KN95 at all times for an additional 5 days.  Sincerely: E-signature: Dr. Colin Benton, DO [place] Ph: [phone]   ------------------------------------------------------------------------------------------------------------------------------    HOME CARE TIPS:  -COVID19 testing information: [email or web address]  Most pharmacies also offer testing and home test kits. If the Covid19 test is positive and you desire antiviral treatment, please contact a [facility] or schedule a follow up virtual visit through your primary care office or through the [facility].  Other test to treat options: [email or web address]  -I sent the medication(s) we discussed to your pharmacy: Meds ordered this encounter  Medications   nirmatrelvir/ritonavir EUA (PAXLOVID) 20 x 150 MG & 10 x '100MG'$  TABS    Sig: Take 3 tablets by mouth 2 (two) times daily for 5 days. (Take nirmatrelvir 150 mg two tablets twice daily for 5 days and ritonavir 100 mg one tablet twice daily for 5 days) Patient GFR is known - last check> 100    Dispense:  30 tablet    Refill:   0   promethazine-dextromethorphan (PROMETHAZINE-DM) 6.25-15 MG/5ML syrup    Sig: Take 5 mLs by mouth 3 (three) times daily as needed for cough.    Dispense:  118 mL    Refill:  0     -I sent in the [facility] treatment or referral you requested per our discussion. Please see the information provided below and discuss further with the pharmacist/treatment team.  -If taking Paxlovid, please review all medications, supplement and over the counter drugs with your pharmacist and ask them to check for any interactions. Please make the following changes to your regular medications while taking Paxlovid: *HOLD your vitamins while taking Paxlovid  -there is a chance of rebound illness with covid after improving. This can happen whether or not you take an antiviral treatment. If you become sick again with covid after getting better, please schedule a follow up virtual visit and isolate again.  -can use tylenol or aleve if needed for fevers, aches and pains per instructions  -nasal saline sinus rinses twice daily  -stay hydrated, drink plenty of fluids and eat small healthy meals - avoid dairy  -follow up with your doctor in 2-3 days unless improving and feeling better  -stay home while sick, except to seek medical care. If you have COVID19, you will likely be contagious for 7-10 days. Flu or Influenza is likely contagious for about 7 days. Other respiratory viral infections remain contagious for 5-10+ days depending on the virus and many other factors. Wear a good mask that fits snugly (such as N95 or KN95) if around others to reduce the risk of transmission.  It was nice to meet you today, and I really hope you are feeling better soon. I help [facility] out with telemedicine visits  on Tuesdays and Thursdays and am happy to help if you need a follow up virtual visit on those days. Otherwise, if you have any concerns or questions following this visit please schedule a follow up visit with your Primary Care  doctor or seek care at a local urgent care clinic to avoid delays in care.    Seek in person care or schedule a follow up video visit promptly if your symptoms worsen, new concerns arise or you are not improving with treatment. Call 911 and/or seek emergency care if your symptoms are severe or life threatening.   See the following link for the most recent information regarding Paxlovid:  www.paxlovid.com   Nirmatrelvir; Ritonavir Tablets What is this medication? NIRMATRELVIR; RITONAVIR (NIR ma TREL vir; ri TOE na veer) treats mild to moderate COVID-19. It may help people who are at high risk of developing severe illness. This medication works by limiting the spread of the virus in your body. The FDA has allowed the emergency use of this medication. This medicine may be used for other purposes; ask your health care provider or pharmacist if you have questions. COMMON BRAND NAME(S): PAXLOVID What should I tell my care team before I take this medication? They need to know if you have any of these conditions: Any allergies Any serious illness Kidney disease Liver disease An unusual or allergic reaction to nirmatrelvir, ritonavir, other medications, foods, dyes, or preservatives Pregnant or trying to get pregnant Breast-feeding How should I use this medication? This product contains 2 different medications that are packaged together. For the standard dose, take 2 pink tablets of nirmatrelvir with 1 white tablet of ritonavir (3 tablets total) by mouth with water twice daily. Talk to your care team if you have kidney disease. You may need a different dose. Swallow the tablets whole. You can take it with or without food. If it upsets your stomach, take it with food. Take all of this medication unless your care team tells you to stop it early. Keep taking it even if you think you are better. Talk to your care team about the use of this medication in children. While it may be prescribed for children  as young as 12 years for selected conditions, precautions do apply. Overdosage: If you think you have taken too much of this medicine contact a poison control center or emergency room at once. NOTE: This medicine is only for you. Do not share this medicine with others. What if I miss a dose? If you miss a dose, take it as soon as you can unless it is more than 8 hours late. If it is more than 8 hours late, skip the missed dose. Take the next dose at the normal time. Do not take extra or 2 doses at the same time to make up for the missed dose. What may interact with this medication? Do not take this medication with any of the following medications: Alfuzosin Certain medications for anxiety or sleep like midazolam, triazolam Certain medications for cancer like apalutamide, enzalutamide Certain medications for cholesterol like lovastatin, simvastatin Certain medications for irregular heart beat like amiodarone, dronedarone, flecainide, propafenone, quinidine Certain medications for pain like meperidine, piroxicam Certain medications for psychotic disorders like clozapine, lurasidone, pimozide Certain medications for seizures like carbamazepine, phenobarbital, phenytoin Colchicine Eletriptan Eplerenone Ergot alkaloids like dihydroergotamine, ergonovine, ergotamine, methylergonovine Finerenone Flibanserin Ivabradine Lomitapide Naloxegol Ranolazine Rifampin Sildenafil Silodosin [facility] John's Wort Tolvaptan Ubrogepant Voclosporin This medication may also interact with the following medications: Bedaquiline Birth control  pills Bosentan Certain antibiotics like erythromycin or clarithromycin Certain medications for blood pressure like amlodipine, diltiazem, felodipine, nicardipine, nifedipine Certain medications for cancer like abemaciclib, ceritinib, dasatinib, encorafenib, ibrutinib, ivosidenib, neratinib, nilotinib, venetoclax, vinblastine, vincristine Certain medications for  cholesterol like atorvastatin, rosuvastatin Certain medications for depression like bupropion, trazodone Certain medications for fungal infections like isavuconazonium, itraconazole, ketoconazole, voriconazole Certain medications for hepatitis C like elbasvir; grazoprevir, dasabuvir; ombitasvir; paritaprevir; ritonavir, glecaprevir; pibrentasvir, sofosbuvir; velpatasvir; voxilaprevir Certain medications for HIV or AIDS Certain medications for irregular heartbeat like lidocaine Certain medications that treat or prevent blood clots like rivaroxaban, warfarin Digoxin Fentanyl Medications that lower your chance of fighting infection like cyclosporine, sirolimus, tacrolimus Methadone Quetiapine Rifabutin Salmeterol Steroid medications like betamethasone, budesonide, ciclesonide, dexamethasone, fluticasone, methylprednisone, mometasone, triamcinolone This list may not describe all possible interactions. Give your health care provider a list of all the medicines, herbs, non-prescription drugs, or dietary supplements you use. Also tell them if you smoke, drink alcohol, or use illegal drugs. Some items may interact with your medicine. What should I watch for while using this medication? Your condition will be monitored carefully while you are receiving this medication. Visit your care team for regular checkups. Tell your care team if your symptoms do not start to get better or if they get worse. If you have untreated HIV infection, this medication may lead to some HIV medications not working as well in the future. Birth control may not work properly while you are taking this medication. Talk to your care team about using an extra method of birth control. What side effects may I notice from receiving this medication? Side effects that you should report to your care team as soon as possible: Allergic reactions--skin rash, itching, hives, swelling of the face, lips, tongue, or throat Liver injury--right  upper belly pain, loss of appetite, nausea, light-colored stool, dark yellow or brown urine, yellowing skin or eyes, unusual weakness or fatigue Redness, blistering, peeling, or loosening of the skin, including inside the mouth Side effects that usually do not require medical attention (report these to your care team if they continue or are bothersome): Change in taste Diarrhea General discomfort and fatigue Increase in blood pressure Muscle pain Nausea Stomach pain This list may not describe all possible side effects. Call your doctor for medical advice about side effects. You may report side effects to FDA at 1-800-FDA-1088. Where should I keep my medication? Keep out of the reach of children and pets. Store at room temperature between 20 and 25 degrees C (68 and 77 degrees F). Get rid of any unused medication after the expiration date. To get rid of medications that are no longer needed or have expired: Take the medication to a medication take-back program. Check with your pharmacy or law enforcement to find a location. If you cannot return the medication, check the label or package insert to see if the medication should be thrown out in the garbage or flushed down the toilet. If you are not sure, ask your care team. If it is safe to put it in the trash, take the medication out of the container. Mix the medication with cat litter, dirt, coffee grounds, or other unwanted substance. Seal the mixture in a bag or container. Put it in the trash. NOTE: This sheet is a summary. It may not cover all possible information. If you have questions about this medicine, talk to your doctor, pharmacist, or health care provider.  2022 Elsevier/Gold Standard (2021-01-21 00:00:00)

## 2022-04-08 NOTE — Progress Notes (Signed)
Virtual Visit via Video Note  I connected with Suzanne Atkinson  on 04/08/22 at  1:20 PM EST by a video enabled telemedicine application and verified that I am speaking with the correct person using two identifiers.  Location patient: [place] Location provider:work or home office Persons participating in the virtual visit: patient, provider  I discussed the limitations and requested verbal permission for telemedicine visit. The patient expressed understanding and agreed to proceed.   HPI:  Acute telemedicine visit for Covid19: -Onset: 2 days ago, tested positive yesterday -Symptoms include:cough, nasal congestion, subjective fever last night - had chills, sore throat, headache -Denies: CP, SOB, NVD, inability to tol oral intake -Has tried: none -Pertinent past medical history: see below, third time having covid -denies any chance of pregnancy -wants to take paxlovid as took last time and felt it helped -Pertinent medication allergies: Allergies  Allergen Reactions   Ceclor [Cefaclor]     As a baby  -COVID-19 vaccine status:  Immunization History  Administered Date(s) Administered   Influenza,inj,Quad PF,6+ Mos 06/08/2018   PFIZER(Purple Top)SARS-COV-2 Vaccination 07/01/2019, 07/29/2019, 01/23/2020   Td 05/05/2012     ROS: See pertinent positives and negatives per HPI.  Past Medical History:  Diagnosis Date   Acid reflux    Allergy    Chickenpox    Colon polyps    Diarrhea    DUB (dysfunctional uterine bleeding)    Sciatica    UTI (urinary tract infection)     Past Surgical History:  Procedure Laterality Date   None       Current Outpatient Medications:    Multiple Vitamins-Minerals (MULTIVITAMIN ADULTS PO), Take by mouth., Disp: , Rfl:    nirmatrelvir/ritonavir EUA (PAXLOVID) 20 x 150 MG & 10 x '100MG'$  TABS, Take 3 tablets by mouth 2 (two) times daily for 5 days. (Take nirmatrelvir 150 mg two tablets twice daily for 5 days and ritonavir 100 mg one tablet twice daily for 5  days) Patient GFR is known - last check> 100, Disp: 30 tablet, Rfl: 0   promethazine-dextromethorphan (PROMETHAZINE-DM) 6.25-15 MG/5ML syrup, Take 5 mLs by mouth 3 (three) times daily as needed for cough., Disp: 118 mL, Rfl: 0   sertraline (ZOLOFT) 50 MG tablet, Take 1.5 tablets (75 mg total) by mouth daily., Disp: 90 tablet, Rfl: 0   loratadine (CLARITIN) 10 MG tablet, 1 tablet Orally Once a day, Disp: , Rfl:   EXAM:  VITALS per patient if applicable:  GENERAL: alert, oriented, appears well and in no acute distress  HEENT: atraumatic, conjunttiva clear, no obvious abnormalities on inspection of external nose and ears, coughs occasionally   NECK: normal movements of the head and neck  LUNGS: on inspection no signs of respiratory distress, breathing rate appears normal, no obvious gross SOB, gasping or wheezing  CV: no obvious cyanosis  MS: moves all visible extremities without noticeable abnormality  PSYCH/NEURO: pleasant and cooperative, no obvious depression or anxiety, speech and thought processing grossly intact  ASSESSMENT AND PLAN:  Discussed the following assessment and plan:  COVID-19   Discussed treatment options, side effect and risk of drug interactions, ideal treatment window, potential complications, isolation and precautions for COVID-19.  Discussed possibility of rebound with or without antivirals. Checked for/reviewed last GFR - listed in HPI if available. Patient denies any hx of renal dysfunction, GFR > 100 on prior checks. She wishes to proceed with antiviral. After lengthy discussion, the patient opted for treatment with Paxlovid due to being higher risk for complications of covid or  severe disease and other factors. Discussed EUA status of this drug and the fact that there is preliminary limited knowledge of risks/interactions/side effects per EUA document vs possible benefits and precautions. This information was shared with patient during the visit and also was  provided in patient instructions. Also, advised that patient discuss risks/interactions and use with pharmacist/treatment team as well. The patient did want a prescription for cough, Cough Rx sent.  Other symptomatic care measures summarized in patient instructions. Work/School slipped offered: provided in patient instructions   Advised to seek prompt virtual visit or in person care if worsening, new symptoms arise, or if is not improving with treatment as expected per our conversation of expected course. Discussed options for follow up care. Did let this patient know that I do telemedicine on Tuesdays and Thursdays for [facility] and those are the days I am logged into the system. Advised to schedule follow up visit with PCP, [facility] virtual visits or [facility] if any further questions or concerns to avoid delays in care.   I discussed the assessment and treatment plan with the patient. The patient was provided an opportunity to ask questions and all were answered. The patient agreed with the plan and demonstrated an understanding of the instructions.     Suzanne Kern, DO

## 2022-04-17 ENCOUNTER — Ambulatory Visit: Payer: BC Managed Care – PPO | Admitting: Internal Medicine

## 2022-05-16 ENCOUNTER — Other Ambulatory Visit: Payer: Self-pay | Admitting: Family Medicine

## 2022-05-16 DIAGNOSIS — F419 Anxiety disorder, unspecified: Secondary | ICD-10-CM

## 2022-05-16 DIAGNOSIS — F320 Major depressive disorder, single episode, mild: Secondary | ICD-10-CM

## 2022-05-22 ENCOUNTER — Encounter: Payer: Self-pay | Admitting: Internal Medicine

## 2022-05-22 ENCOUNTER — Other Ambulatory Visit: Payer: Self-pay | Admitting: Internal Medicine

## 2022-05-22 ENCOUNTER — Ambulatory Visit (INDEPENDENT_AMBULATORY_CARE_PROVIDER_SITE_OTHER): Payer: BC Managed Care – PPO | Admitting: Internal Medicine

## 2022-05-22 VITALS — BP 126/70 | HR 72 | Temp 97.8°F | Ht 66.0 in | Wt 223.6 lb

## 2022-05-22 DIAGNOSIS — Z1231 Encounter for screening mammogram for malignant neoplasm of breast: Secondary | ICD-10-CM | POA: Diagnosis not present

## 2022-05-22 DIAGNOSIS — E782 Mixed hyperlipidemia: Secondary | ICD-10-CM

## 2022-05-22 DIAGNOSIS — F418 Other specified anxiety disorders: Secondary | ICD-10-CM

## 2022-05-22 DIAGNOSIS — E559 Vitamin D deficiency, unspecified: Secondary | ICD-10-CM

## 2022-05-22 DIAGNOSIS — Z23 Encounter for immunization: Secondary | ICD-10-CM

## 2022-05-22 LAB — LIPID PANEL
Cholesterol: 257 mg/dL — ABNORMAL HIGH (ref 0–200)
HDL: 58.8 mg/dL (ref >39.00)
LDL Cholesterol: 177 mg/dL — ABNORMAL HIGH (ref 0–99)
NonHDL: 198.5
Total CHOL/HDL Ratio: 4
Triglycerides: 108 mg/dL (ref 0.0–149.0)
VLDL: 21.6 mg/dL (ref 0.0–40.0)

## 2022-05-22 LAB — CBC WITH DIFFERENTIAL/PLATELET
Basophils Absolute: 0 10*3/uL (ref 0.0–0.1)
Basophils Relative: 0.4 % (ref 0.0–3.0)
Eosinophils Absolute: 0.1 10*3/uL (ref 0.0–0.7)
Eosinophils Relative: 1.1 % (ref 0.0–5.0)
HCT: 43.5 % (ref 36.0–46.0)
Hemoglobin: 14.5 g/dL (ref 12.0–15.0)
Lymphocytes Relative: 24.5 % (ref 12.0–46.0)
Lymphs Abs: 1.4 10*3/uL (ref 0.7–4.0)
MCHC: 33.3 g/dL (ref 30.0–36.0)
MCV: 91.4 fl (ref 78.0–100.0)
Monocytes Absolute: 0.5 10*3/uL (ref 0.1–1.0)
Monocytes Relative: 9.6 % (ref 3.0–12.0)
Neutro Abs: 3.6 10*3/uL (ref 1.4–7.7)
Neutrophils Relative %: 64.4 % (ref 43.0–77.0)
Platelets: 283 10*3/uL (ref 150.0–400.0)
RBC: 4.76 Mil/uL (ref 3.87–5.11)
RDW: 14.3 % (ref 11.5–15.5)
WBC: 5.5 10*3/uL (ref 4.0–10.5)

## 2022-05-22 LAB — COMPREHENSIVE METABOLIC PANEL
ALT: 23 U/L (ref 0–35)
AST: 19 U/L (ref 0–37)
Albumin: 4.1 g/dL (ref 3.5–5.2)
Alkaline Phosphatase: 69 U/L (ref 39–117)
BUN: 12 mg/dL (ref 6–23)
CO2: 30 mEq/L (ref 19–32)
Calcium: 9.4 mg/dL (ref 8.4–10.5)
Chloride: 100 mEq/L (ref 96–112)
Creatinine, Ser: 0.78 mg/dL (ref 0.40–1.20)
GFR: 93.23 mL/min (ref >60.00)
Glucose, Bld: 90 mg/dL (ref 70–99)
Potassium: 4.5 mEq/L (ref 3.5–5.1)
Sodium: 138 mEq/L (ref 135–145)
Total Bilirubin: 0.4 mg/dL (ref 0.2–1.2)
Total Protein: 7.3 g/dL (ref 6.0–8.3)

## 2022-05-22 LAB — VITAMIN D 25 HYDROXY (VIT D DEFICIENCY, FRACTURES): VITD: 21.36 ng/mL — ABNORMAL LOW (ref 30.00–100.00)

## 2022-05-22 LAB — HEMOGLOBIN A1C: Hgb A1c MFr Bld: 5.8 % (ref 4.6–6.5)

## 2022-05-22 MED ORDER — ATORVASTATIN CALCIUM 40 MG PO TABS: 40.0000 mg | ORAL_TABLET | Freq: Every day | ORAL | 1 refills | Status: DC

## 2022-05-22 MED ORDER — VITAMIN D (ERGOCALCIFEROL) 1.25 MG (50000 UNIT) PO CAPS: 50000.0000 [IU] | ORAL_CAPSULE | ORAL | 0 refills | Status: AC

## 2022-05-22 NOTE — Progress Notes (Signed)
Established Patient Office Visit     CC/Reason for Visit: Follow-up chronic conditions  HPI: Brynlie Daza is a 42 y.o. female who is coming in today for the above mentioned reasons. Past Medical History is significant for: Depression, anxiety that is well-controlled currently on sertraline 75 mg, hyperlipidemia.  She is no longer seeing a Social worker.  She is requesting a flu vaccine.  She is overdue for mammogram.  She is requesting labs.  She is otherwise feeling well.   Past Medical/Surgical History: Past Medical History:  Diagnosis Date   Acid reflux    Allergy    Chickenpox    Colon polyps    Diarrhea    DUB (dysfunctional uterine bleeding)    Sciatica    UTI (urinary tract infection)     Past Surgical History:  Procedure Laterality Date   None      Social History:  reports that she has never smoked. She has never used smokeless tobacco. She reports current alcohol use of about 1.0 standard drink of alcohol per week. She reports that she does not use drugs.  Allergies: Allergies  Allergen Reactions   Ceclor [Cefaclor]     As a baby    Family History:  Family History  Problem Relation Age of Onset   Cancer - Colon Father    High blood pressure Mother    High blood pressure Maternal Grandmother      Current Outpatient Medications:    loratadine (CLARITIN) 10 MG tablet, 1 tablet Orally Once a day, Disp: , Rfl:    Multiple Vitamins-Minerals (MULTIVITAMIN ADULTS PO), Take by mouth., Disp: , Rfl:    sertraline (ZOLOFT) 50 MG tablet, TAKE 1 AND 1/2 TABLETS BY MOUTH DAILY, Disp: 135 tablet, Rfl: 1  Review of Systems:  Negative unless indicated in HPI.   Physical Exam: Vitals:   05/22/22 1051  BP: 126/70  Pulse: 72  Temp: 97.8 F (36.6 C)  TempSrc: Oral  SpO2: 98%  Weight: 223 lb 9 oz (101.4 kg)  Height: '5\' 6"'$  (1.676 m)    Body mass index is 36.08 kg/m.   Physical Exam Vitals reviewed.  Constitutional:      Appearance: Normal  appearance.  HENT:     Head: Normocephalic and atraumatic.  Eyes:     Conjunctiva/sclera: Conjunctivae normal.     Pupils: Pupils are equal, round, and reactive to light.  Cardiovascular:     Rate and Rhythm: Normal rate and regular rhythm.  Pulmonary:     Effort: Pulmonary effort is normal.     Breath sounds: Normal breath sounds.  Skin:    General: Skin is warm and dry.  Neurological:     General: No focal deficit present.     Mental Status: She is alert and oriented to person, place, and time.  Psychiatric:        Mood and Affect: Mood normal.        Behavior: Behavior normal.        Thought Content: Thought content normal.        Judgment: Judgment normal.     Flowsheet Row Office Visit from 03/20/2022 in [facility] at [facility]  PHQ-9 Total Score 8        Impression and Plan:  Depression with anxiety - Plan: CBC with Differential/Platelet, Comprehensive metabolic panel, Hemoglobin A1c, VITAMIN D 25 Hydroxy (Vit-D Deficiency, Fractures), VITAMIN D 25 Hydroxy (Vit-D Deficiency, Fractures), Hemoglobin A1c, Comprehensive metabolic panel, CBC with Differential/Platelet  Mixed hyperlipidemia -  Plan: Lipid panel, Lipid panel  Need for influenza vaccination  Encounter for screening mammogram for malignant neoplasm of breast - Plan: MM Digital Screening  -Labs will be ordered today as requested. -Flu vaccine administered today. -Sent for mammogram as she is overdue. -Colon and cervical cancer screening are up-to-date. -She is also overdue for COVID-vaccine which she declines today. -PHQ-9 score is 8, she feels like her mood is stable, I have urged her to attend CBT again.  Continue Zoloft.  Time spent:32 minutes reviewing chart, interviewing and examining patient and formulating plan of care.     [name], MD  Primary Care at [facility]

## 2022-05-23 ENCOUNTER — Other Ambulatory Visit: Payer: Self-pay

## 2022-05-23 DIAGNOSIS — E559 Vitamin D deficiency, unspecified: Secondary | ICD-10-CM

## 2022-05-23 DIAGNOSIS — E782 Mixed hyperlipidemia: Secondary | ICD-10-CM

## 2022-08-08 ENCOUNTER — Other Ambulatory Visit: Payer: Self-pay | Admitting: Internal Medicine

## 2022-08-08 DIAGNOSIS — E559 Vitamin D deficiency, unspecified: Secondary | ICD-10-CM

## 2022-08-28 ENCOUNTER — Other Ambulatory Visit: Payer: BC Managed Care – PPO

## 2022-09-04 ENCOUNTER — Other Ambulatory Visit: Payer: Self-pay | Admitting: Internal Medicine

## 2022-09-04 ENCOUNTER — Other Ambulatory Visit (INDEPENDENT_AMBULATORY_CARE_PROVIDER_SITE_OTHER): Payer: BC Managed Care – PPO

## 2022-09-04 DIAGNOSIS — E782 Mixed hyperlipidemia: Secondary | ICD-10-CM

## 2022-09-04 DIAGNOSIS — E559 Vitamin D deficiency, unspecified: Secondary | ICD-10-CM

## 2022-09-04 LAB — LIPID PANEL
Cholesterol: 172 mg/dL (ref 0–200)
HDL: 55.7 mg/dL (ref >39.00)
LDL Cholesterol: 103 mg/dL — ABNORMAL HIGH (ref 0–99)
NonHDL: 116.22
Total CHOL/HDL Ratio: 3
Triglycerides: 64 mg/dL (ref 0.0–149.0)
VLDL: 12.8 mg/dL (ref 0.0–40.0)

## 2022-09-04 LAB — VITAMIN D 25 HYDROXY (VIT D DEFICIENCY, FRACTURES): VITD: 23.17 ng/mL — ABNORMAL LOW (ref 30.00–100.00)

## 2022-09-04 MED ORDER — VITAMIN D (ERGOCALCIFEROL) 1.25 MG (50000 UNIT) PO CAPS: 50000.0000 [IU] | ORAL_CAPSULE | ORAL | 0 refills | Status: AC

## 2022-10-01 ENCOUNTER — Encounter: Payer: Self-pay | Admitting: Internal Medicine

## 2022-10-01 ENCOUNTER — Ambulatory Visit (INDEPENDENT_AMBULATORY_CARE_PROVIDER_SITE_OTHER): Payer: BC Managed Care – PPO | Admitting: Internal Medicine

## 2022-10-01 VITALS — BP 120/80 | HR 70 | Temp 98.5°F | Wt 230.5 lb

## 2022-10-01 DIAGNOSIS — J069 Acute upper respiratory infection, unspecified: Secondary | ICD-10-CM | POA: Diagnosis not present

## 2022-10-01 NOTE — Progress Notes (Signed)
Established Patient Office Visit     CC/Reason for Visit: URI symptoms  HPI: Suzanne Atkinson is a 42 y.o. female who is coming in today for the above mentioned reasons.  For the past week has been experiencing sore throat, headache, cough productive of clear sputum, ear pressure, rhinorrhea and congestion.  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  No recent travel.   Past Medical/Surgical History: Past Medical History:  Diagnosis Date   Acid reflux    Allergy    Chickenpox    Colon polyps    Diarrhea    DUB (dysfunctional uterine bleeding)    Sciatica    UTI (urinary tract infection)     Past Surgical History:  Procedure Laterality Date   None      Social History:  reports that she has never smoked. She has never used smokeless tobacco. She reports current alcohol use of about 1.0 standard drink of alcohol per week. She reports that she does not use drugs.  Allergies: Allergies  Allergen Reactions   Ceclor [Cefaclor]     As a baby    Family History:  Family History  Problem Relation Age of Onset   Cancer - Colon Father    High blood pressure Mother    High blood pressure Maternal Grandmother      Current Outpatient Medications:    atorvastatin (LIPITOR) 40 MG tablet, Take 1 tablet (40 mg total) by mouth daily., Disp: 90 tablet, Rfl: 1   loratadine (CLARITIN) 10 MG tablet, 1 tablet Orally Once a day, Disp: , Rfl:    Multiple Vitamins-Minerals (MULTIVITAMIN ADULTS PO), Take by mouth., Disp: , Rfl:    sertraline (ZOLOFT) 50 MG tablet, TAKE 1 AND 1/2 TABLETS BY MOUTH DAILY, Disp: 135 tablet, Rfl: 1   Vitamin D, Ergocalciferol, (DRISDOL) 1.25 MG (50000 UNIT) CAPS capsule, Take 1 capsule (50,000 Units total) by mouth every 7 (seven) days for 12 doses., Disp: 12 capsule, Rfl: 0  Review of Systems:  Negative unless indicated in HPI.   Physical Exam: Vitals:   10/01/22 1553  BP: 120/80  Pulse: 70  Temp: 98.5 F (36.9 C)  TempSrc: Oral  SpO2:  98%  Weight: 230 lb 8 oz (104.6 kg)    Body mass index is 37.2 kg/m.   Physical Exam Vitals reviewed.  Constitutional:      Appearance: Normal appearance.  HENT:     Right Ear: Tympanic membrane, ear canal and external ear normal.     Left Ear: Tympanic membrane, ear canal and external ear normal.     Mouth/Throat:     Mouth: Mucous membranes are moist.     Pharynx: Oropharynx is clear.  Eyes:     Conjunctiva/sclera: Conjunctivae normal.     Pupils: Pupils are equal, round, and reactive to light.  Cardiovascular:     Rate and Rhythm: Normal rate and regular rhythm.  Pulmonary:     Effort: Pulmonary effort is normal.     Breath sounds: Normal breath sounds.  Neurological:     Mental Status: She is alert.      Impression and Plan:  Viral upper respiratory tract infection   -Given exam findings, PNA, pharyngitis, ear infection are not likely, hence abx have not been prescribed. -Have advised rest, fluids, OTC antihistamines, cough suppressants and mucinex. -RTC if no improvement in 10-14 days.   Time spent:22 minutes reviewing chart, interviewing and examining patient and formulating plan of care.     Peggye Pitt  Loreta Ave, MD [place] [facility]

## 2022-11-26 ENCOUNTER — Other Ambulatory Visit: Payer: Self-pay | Admitting: Internal Medicine

## 2022-11-26 DIAGNOSIS — F320 Major depressive disorder, single episode, mild: Secondary | ICD-10-CM

## 2022-11-26 DIAGNOSIS — E782 Mixed hyperlipidemia: Secondary | ICD-10-CM

## 2022-11-26 DIAGNOSIS — F419 Anxiety disorder, unspecified: Secondary | ICD-10-CM

## 2022-11-30 ENCOUNTER — Other Ambulatory Visit: Payer: Self-pay | Admitting: Internal Medicine

## 2022-11-30 DIAGNOSIS — E559 Vitamin D deficiency, unspecified: Secondary | ICD-10-CM

## 2022-12-10 ENCOUNTER — Encounter: Payer: Self-pay | Admitting: Internal Medicine

## 2022-12-10 ENCOUNTER — Telehealth (INDEPENDENT_AMBULATORY_CARE_PROVIDER_SITE_OTHER): Payer: Managed Care, Other (non HMO) | Admitting: Internal Medicine

## 2022-12-10 DIAGNOSIS — J029 Acute pharyngitis, unspecified: Secondary | ICD-10-CM | POA: Diagnosis not present

## 2022-12-10 DIAGNOSIS — Z20822 Contact with and (suspected) exposure to covid-19: Secondary | ICD-10-CM

## 2022-12-10 DIAGNOSIS — J22 Unspecified acute lower respiratory infection: Secondary | ICD-10-CM

## 2022-12-10 NOTE — Progress Notes (Signed)
Virtual Visit via Video Note  I connected with Suzanne Atkinson on 12/10/22 at  9:15 AM EDT by a video enabled telemedicine application and verified that I am speaking with the correct person using two identifiers. Location patient: home Location provider:work office Persons participating in the virtual visit: patient, provider  Patient aware  of the limitations of evaluation and management by telemedicine and  availability of in person appointments. and agreed to proceed.   HPI: Suzanne Atkinson presents for video visit onset of sx aug 1-2 though allergy but then progressed  sore throat and HA tired  but no fever cough congestion  using Claritin and took benadryl last pm  tested neg covid ht x2 this week day 4 and 6  Husband tested positive  and had uri cough sx Neg strep exposure ;works in vet office  No difficulty swallowing at this time .  ROS: See pertinent positives and negatives per HPI.  Past Medical History:  Diagnosis Date   Acid reflux    Allergy    Chickenpox    Colon polyps    Diarrhea    DUB (dysfunctional uterine bleeding)    Sciatica    UTI (urinary tract infection)     Past Surgical History:  Procedure Laterality Date   None      Family History  Problem Relation Age of Onset   Cancer - Colon Father    High blood pressure Mother    High blood pressure Maternal Grandmother     Social History   Tobacco Use   Smoking status: Never   Smokeless tobacco: Never  Substance Use Topics   Alcohol use: Yes    Alcohol/week: 1.0 standard drink of alcohol    Types: 1 Glasses of wine per week    Comment: Once a month   Drug use: No      Current Outpatient Medications:    atorvastatin (LIPITOR) 40 MG tablet, TAKE 1 TABLET BY MOUTH EVERY DAY, Disp: 90 tablet, Rfl: 0   loratadine (CLARITIN) 10 MG tablet, 1 tablet Orally Once a day, Disp: , Rfl:    Multiple Vitamins-Minerals (MULTIVITAMIN ADULTS PO), Take by mouth., Disp: , Rfl:    sertraline  (ZOLOFT) 50 MG tablet, TAKE 1 AND 1/2 TABLETS DAILY BY MOUTH, Disp: 135 tablet, Rfl: 0  EXAM: BP Readings from Last 3 Encounters:  10/01/22 120/80  05/22/22 126/70  03/20/22 110/78    VITALS per patient if applicable:  GENERAL: alert, oriented, appears normally in no acute distress mildly hoarse non toxic   no stridor or cough during exam   HEENT: atraumatic, conjunttiva clear, no obvious abnormalities on inspection of external nose and ears  NECK: normal movements of the head and neck no obv adenopathy   LUNGS: on inspection no signs of respiratory distress, breathing rate appears normal, no obvious gross SOB, gasping or wheezing  CV: no obvious cyanosis  MS: moves all visible extremities without noticeable abnormality  PSYCH/NEURO: pleasant and cooperative, no obvious depression or anxiety, speech and thought processing grossly intact Lab Results  Component Value Date   WBC 5.5 05/22/2022   HGB 14.5 05/22/2022   HCT 43.5 05/22/2022   PLT 283.0 05/22/2022   GLUCOSE 90 05/22/2022   CHOL 172 09/04/2022   TRIG 64.0 09/04/2022   HDL 55.70 09/04/2022   LDLCALC 103 (H) 09/04/2022   ALT 23 05/22/2022   AST 19 05/22/2022   NA 138 05/22/2022   K 4.5 05/22/2022   CL 100 05/22/2022  CREATININE 0.78 05/22/2022   BUN 12 05/22/2022   CO2 30 05/22/2022   TSH 1.09 03/20/2022   HGBA1C 5.8 05/22/2022    ASSESSMENT AND PLAN:  Discussed the following assessment and plan:    ICD-10-CM   1. Sore throat  J02.9     2. Close exposure to COVID-19 virus  Z20.822     3. Acute respiratory infection  J22       Counseled. Although home testing negative most likely a covid inf syndrome   disc poss strep throat but  no typical sx or exposures and st seem more tracheal . Now day day 6 of  sx   Not high risk for complication if covid  inf At this time she feels ok not to check for strep .  But if gets throat sx and self check different contact med team for fu  At this time rest and warm  liquids sx rx and  contact  if  persistent or progressive   Expectant management and discussion of plan and treatment with opportunity to ask questions and all were answered. The patient agreed with the plan and demonstrated an understanding of the instructions.   Advised to call back or seek an in-person evaluation if worsening  or having  further concerns  in interim. Return if symptoms worsen or fail to improve.   Berniece Andreas, MD

## 2022-12-14 ENCOUNTER — Encounter: Payer: Self-pay | Admitting: Internal Medicine

## 2022-12-15 NOTE — Telephone Encounter (Signed)
Called Patient and she  is aware, patient states she is feeling better.

## 2022-12-28 ENCOUNTER — Other Ambulatory Visit: Payer: Self-pay | Admitting: Internal Medicine

## 2022-12-28 DIAGNOSIS — F320 Major depressive disorder, single episode, mild: Secondary | ICD-10-CM

## 2022-12-28 DIAGNOSIS — F419 Anxiety disorder, unspecified: Secondary | ICD-10-CM

## 2023-03-25 ENCOUNTER — Other Ambulatory Visit: Payer: Self-pay | Admitting: Internal Medicine

## 2023-03-25 DIAGNOSIS — E782 Mixed hyperlipidemia: Secondary | ICD-10-CM

## 2023-06-22 ENCOUNTER — Other Ambulatory Visit: Payer: Self-pay | Admitting: Internal Medicine

## 2023-06-22 DIAGNOSIS — E782 Mixed hyperlipidemia: Secondary | ICD-10-CM

## 2023-12-29 ENCOUNTER — Encounter: Payer: Self-pay | Admitting: Emergency Medicine

## 2023-12-29 ENCOUNTER — Ambulatory Visit: Admission: EM | Admit: 2023-12-29 | Discharge: 2023-12-29 | Disposition: A | Discharge status: Home / Self Care

## 2023-12-29 DIAGNOSIS — T7840XA Allergy, unspecified, initial encounter: Secondary | ICD-10-CM

## 2023-12-29 MED ORDER — EPINEPHRINE 0.3 MG/0.3ML IJ SOAJ
0.3000 mg | Freq: Once | INTRAMUSCULAR | Status: AC
Start: 2023-12-29 — End: ?

## 2023-12-29 NOTE — Discharge Instructions (Addendum)
 Continue Claritin.  Use epi pen if further symptoms

## 2023-12-29 NOTE — ED Triage Notes (Signed)
 Pt states she has had slight sore throat and headache due to allergies for a few weeks. Today she began to developed tongue swelling and has taken Claritin 2x which has helped.  Denies sob or chest pain  Pt does not know of any change that could cause allergic reaction. States she recently cut out dairy.

## 2023-12-31 NOTE — ED Provider Notes (Signed)
 [facility]    CSN: [id number] Arrival date & time: 12/29/23  1746      History   Chief Complaint Chief Complaint  Patient presents with   Sore Throat   Oral Swelling    Tongue swelling    HPI Suzanne Atkinson is a 43 y.o. female.   Patient reports prior to coming in for evaluation she had an episode of tongue swelling.  Patient reports that she noticed her tongue felt large in her mouth.  Patient states when she looked her tongue was swollen.  Patient reports taking Claritin and swelling resolved.  Patient reports she feels like she has been having increasing allergy symptoms over the last couple weeks.  Patient complains of sore throat headache and congestion on and off.  Patient denies any fever or chills she has not had any nausea or vomiting.  Patient denies any new medicines..  She is not taking lisinopril or blood pressure medications.  Patient reports she has had a similar episode in the past with allergies  The history is provided by the patient. No language interpreter was used.  Sore Throat    Past Medical History:  Diagnosis Date   Acid reflux    Allergy    Chickenpox    Colon polyps    Diarrhea    DUB (dysfunctional uterine bleeding)    Sciatica    UTI (urinary tract infection)     Patient Active Problem List   Diagnosis Date Noted   Vitamin D  deficiency 05/22/2022   Depression with anxiety 04/26/2020   Hyperlipidemia 09/22/2019   Slurred speech 09/29/2012   Acid reflux     Past Surgical History:  Procedure Laterality Date   None      OB History   No obstetric history on file.      Home Medications    Prior to Admission medications   Medication Sig Start Date End Date Taking? Authorizing Provider  montelukast  (SINGULAIR ) 10 MG tablet Take 10 mg by mouth at bedtime. 06/30/23  Yes [provider]  atorvastatin  (LIPITOR) 40 MG tablet TAKE 1 TABLET BY MOUTH EVERY DAY Patient not taking: Reported on 12/29/2023 03/25/23    Theophilus Andrews, Tully GRADE, MD  buPROPion (WELLBUTRIN XL) 150 MG 24 hr tablet Take 150 mg by mouth every morning.    [provider]  loratadine (CLARITIN) 10 MG tablet 1 tablet Orally Once a day    [provider]  Multiple Vitamins-Minerals (MULTIVITAMIN ADULTS PO) Take by mouth.    [provider]  sertraline  (ZOLOFT ) 50 MG tablet TAKE 1 TABLET BY MOUTH EVERY DAY 12/30/22   Theophilus Andrews, Tully GRADE, MD    Family History Family History  Problem Relation Age of Onset   Cancer - Colon Father    High blood pressure Mother    High blood pressure Maternal Grandmother     Social History Social History   Tobacco Use   Smoking status: Never   Smokeless tobacco: Never  Substance Use Topics   Alcohol use: Yes    Alcohol/week: 1.0 standard drink of alcohol    Types: 1 Glasses of wine per week    Comment: Once a month   Drug use: No     Allergies   Ceclor [cefaclor]   Review of Systems Review of Systems  HENT:  Negative for sore throat and trouble swallowing.   All other systems reviewed and are negative.    Physical Exam Triage Vital Signs ED Triage Vitals  Encounter Vitals Group     BP 12/29/23 1802 127/83     Girls Systolic BP Percentile --      Girls Diastolic BP Percentile --      Boys Systolic BP Percentile --      Boys Diastolic BP Percentile --      Pulse Rate 12/29/23 1802 63     Resp 12/29/23 1802 17     Temp 12/29/23 1802 97.7 F (36.5 C)     Temp Source 12/29/23 1802 Oral     SpO2 12/29/23 1802 97 %     Weight --      Height --      Head Circumference --      Peak Flow --      Pain Score 12/29/23 1801 0     Pain Loc --      Pain Education --      Exclude from Growth Chart --    No data found.  Updated Vital Signs BP 127/83 (BP Location: Right Arm)   Pulse 63   Temp 97.7 F (36.5 C) (Oral)   Resp 17   SpO2 97%   Visual Acuity Right Eye Distance:   Left Eye Distance:   Bilateral Distance:    Right Eye Near:    Left Eye Near:    Bilateral Near:     Physical Exam Vitals and nursing note reviewed.  Constitutional:      Appearance: She is well-developed.  HENT:     Head: Normocephalic.     Mouth/Throat:     Mouth: Mucous membranes are moist.     Comments: No obvious tongue swelling throat is clear, no difficulty swallowing Eyes:     Conjunctiva/sclera: Conjunctivae normal.     Pupils: Pupils are equal, round, and reactive to light.  Cardiovascular:     Rate and Rhythm: Normal rate.     Heart sounds: Normal heart sounds.  Pulmonary:     Effort: Pulmonary effort is normal.     Comments: Lungs are clear normal respiratory rate, no difficulty breathing Abdominal:     General: There is no distension.  Musculoskeletal:        General: Normal range of motion.     Cervical back: Normal range of motion.  Skin:    General: Skin is warm.  Neurological:     General: No focal deficit present.     Mental Status: She is alert and oriented to person, place, and time.      UC Treatments / Results  Labs (all labs ordered are listed, but only abnormal results are displayed) Labs Reviewed - No data to display  EKG   Radiology No results found.  Procedures Procedures (including critical care time)  Medications Ordered in UC Medications  EPINEPHrine  (EPI-PEN) injection 0.3 mg (has no administration in time range)    Initial Impression / Assessment and Plan / UC Course  I have reviewed the triage vital signs and the nursing notes.  Pertinent labs & imaging results that were available during my care of the patient were reviewed by me and considered in my medical decision making (see chart for details).     Patient observed for an hour no further tongue swelling.  Patient is advised to continue Claritin.  Patient is given a prescription for EpiPen  to keep with her in case she has further episodes.  Patient is advised to follow-up with her primary care physician and allergist for further  evaluation.  Patient is advised  if she has further symptoms or worsening symptoms she should go to the emergency department for evaluation Final Clinical Impressions(s) / UC Diagnoses   Final diagnoses:  Allergic reaction, initial encounter     Discharge Instructions      Continue Claritin.  Use epi pen if further symptoms    ED Prescriptions   None    PDMP not reviewed this encounter. An After Visit Summary was printed and given to the patient.          Flint Sonny POUR, [place] 12/31/23 1344
